# Patient Record
Sex: Female | Born: 1940
Health system: Southern US, Community
[De-identification: ages and names within clinical notes are randomized; demographics above are authoritative.]

## PROBLEM LIST (undated history)

## (undated) DIAGNOSIS — C801 Malignant (primary) neoplasm, unspecified: Secondary | ICD-10-CM

## (undated) HISTORY — DX: Malignant (primary) neoplasm, unspecified: C80.1

## (undated) HISTORY — PX: CHOLECYSTECTOMY: SHX55

---

## 1998-02-05 ENCOUNTER — Ambulatory Visit (HOSPITAL_COMMUNITY): Admission: RE | Admit: 1998-02-05 | Discharge: 1998-02-05 | Payer: Self-pay | Admitting: Internal Medicine

## 1998-03-11 ENCOUNTER — Ambulatory Visit (HOSPITAL_COMMUNITY): Admission: RE | Admit: 1998-03-11 | Discharge: 1998-03-11 | Payer: Self-pay | Admitting: Neurosurgery

## 1998-04-10 ENCOUNTER — Inpatient Hospital Stay (HOSPITAL_COMMUNITY): Admission: RE | Admit: 1998-04-10 | Discharge: 1998-04-11 | Payer: Self-pay | Admitting: Neurosurgery

## 1998-12-06 ENCOUNTER — Other Ambulatory Visit: Admission: RE | Admit: 1998-12-06 | Discharge: 1998-12-06 | Payer: Self-pay | Admitting: Gynecology

## 1999-11-19 ENCOUNTER — Ambulatory Visit (HOSPITAL_BASED_OUTPATIENT_CLINIC_OR_DEPARTMENT_OTHER): Admission: RE | Admit: 1999-11-19 | Discharge: 1999-11-19 | Payer: Self-pay

## 1999-12-24 ENCOUNTER — Other Ambulatory Visit: Admission: RE | Admit: 1999-12-24 | Discharge: 1999-12-24 | Payer: Self-pay | Admitting: Gynecology

## 2001-01-11 ENCOUNTER — Other Ambulatory Visit: Admission: RE | Admit: 2001-01-11 | Discharge: 2001-01-11 | Payer: Self-pay | Admitting: Gynecology

## 2001-08-12 ENCOUNTER — Encounter: Payer: Self-pay | Admitting: Emergency Medicine

## 2001-08-12 ENCOUNTER — Emergency Department (HOSPITAL_COMMUNITY): Admission: EM | Admit: 2001-08-12 | Discharge: 2001-08-12 | Payer: Self-pay | Admitting: *Deleted

## 2001-08-12 ENCOUNTER — Encounter: Payer: Self-pay | Admitting: Internal Medicine

## 2001-08-12 ENCOUNTER — Ambulatory Visit (HOSPITAL_COMMUNITY): Admission: RE | Admit: 2001-08-12 | Discharge: 2001-08-12 | Payer: Self-pay | Admitting: Internal Medicine

## 2002-03-03 ENCOUNTER — Other Ambulatory Visit: Admission: RE | Admit: 2002-03-03 | Discharge: 2002-03-03 | Payer: Self-pay | Admitting: Gynecology

## 2004-01-04 ENCOUNTER — Other Ambulatory Visit: Admission: RE | Admit: 2004-01-04 | Discharge: 2004-01-04 | Payer: Self-pay | Admitting: Gynecology

## 2004-02-19 ENCOUNTER — Encounter (INDEPENDENT_AMBULATORY_CARE_PROVIDER_SITE_OTHER): Payer: Self-pay | Admitting: *Deleted

## 2004-02-19 ENCOUNTER — Ambulatory Visit (HOSPITAL_BASED_OUTPATIENT_CLINIC_OR_DEPARTMENT_OTHER): Admission: RE | Admit: 2004-02-19 | Discharge: 2004-02-19 | Payer: Self-pay | Admitting: General Surgery

## 2004-02-19 ENCOUNTER — Ambulatory Visit (HOSPITAL_COMMUNITY): Admission: RE | Admit: 2004-02-19 | Discharge: 2004-02-19 | Payer: Self-pay | Admitting: General Surgery

## 2004-12-14 ENCOUNTER — Emergency Department (HOSPITAL_COMMUNITY): Admission: EM | Admit: 2004-12-14 | Discharge: 2004-12-14 | Payer: Self-pay | Admitting: *Deleted

## 2006-04-21 ENCOUNTER — Encounter: Admission: RE | Admit: 2006-04-21 | Discharge: 2006-04-21 | Payer: Self-pay | Admitting: Internal Medicine

## 2006-05-11 ENCOUNTER — Inpatient Hospital Stay (HOSPITAL_COMMUNITY): Admission: RE | Admit: 2006-05-11 | Discharge: 2006-05-13 | Payer: Self-pay | Admitting: Orthopedic Surgery

## 2007-11-23 ENCOUNTER — Ambulatory Visit (HOSPITAL_COMMUNITY): Admission: RE | Admit: 2007-11-23 | Discharge: 2007-11-23 | Payer: Self-pay | Admitting: Internal Medicine

## 2008-08-30 ENCOUNTER — Emergency Department (HOSPITAL_COMMUNITY): Admission: EM | Admit: 2008-08-30 | Discharge: 2008-08-30 | Payer: Self-pay | Admitting: *Deleted

## 2009-05-25 DIAGNOSIS — R7309 Other abnormal glucose: Secondary | ICD-10-CM | POA: Insufficient documentation

## 2009-05-25 DIAGNOSIS — E039 Hypothyroidism, unspecified: Secondary | ICD-10-CM | POA: Insufficient documentation

## 2009-05-25 DIAGNOSIS — J309 Allergic rhinitis, unspecified: Secondary | ICD-10-CM | POA: Insufficient documentation

## 2009-05-25 DIAGNOSIS — K5730 Diverticulosis of large intestine without perforation or abscess without bleeding: Secondary | ICD-10-CM | POA: Insufficient documentation

## 2009-05-25 DIAGNOSIS — Z720 Tobacco use: Secondary | ICD-10-CM | POA: Insufficient documentation

## 2009-05-25 DIAGNOSIS — E785 Hyperlipidemia, unspecified: Secondary | ICD-10-CM | POA: Insufficient documentation

## 2009-05-25 DIAGNOSIS — I872 Venous insufficiency (chronic) (peripheral): Secondary | ICD-10-CM | POA: Insufficient documentation

## 2010-01-29 ENCOUNTER — Encounter: Admission: RE | Admit: 2010-01-29 | Discharge: 2010-01-29 | Payer: Self-pay | Admitting: Surgery

## 2010-02-01 ENCOUNTER — Ambulatory Visit (HOSPITAL_BASED_OUTPATIENT_CLINIC_OR_DEPARTMENT_OTHER): Admission: RE | Admit: 2010-02-01 | Discharge: 2010-02-01 | Payer: Self-pay | Admitting: Surgery

## 2010-11-11 LAB — POCT HEMOGLOBIN-HEMACUE: Hemoglobin: 14.1 g/dL (ref 12.0–15.0)

## 2010-12-09 LAB — GLUCOSE, CAPILLARY: Glucose-Capillary: 128 mg/dL — ABNORMAL HIGH (ref 70–99)

## 2010-12-25 ENCOUNTER — Ambulatory Visit (HOSPITAL_BASED_OUTPATIENT_CLINIC_OR_DEPARTMENT_OTHER)
Admission: RE | Admit: 2010-12-25 | Discharge: 2010-12-25 | Disposition: A | Discharge status: Home / Self Care | Payer: Medicare Other | Source: Ambulatory Visit | Attending: Surgery | Admitting: Surgery

## 2010-12-25 ENCOUNTER — Other Ambulatory Visit: Payer: Self-pay | Admitting: Surgery

## 2010-12-25 DIAGNOSIS — C44721 Squamous cell carcinoma of skin of unspecified lower limb, including hip: Secondary | ICD-10-CM | POA: Insufficient documentation

## 2010-12-25 DIAGNOSIS — J4489 Other specified chronic obstructive pulmonary disease: Secondary | ICD-10-CM | POA: Insufficient documentation

## 2010-12-25 DIAGNOSIS — F17200 Nicotine dependence, unspecified, uncomplicated: Secondary | ICD-10-CM | POA: Insufficient documentation

## 2010-12-25 DIAGNOSIS — Z01812 Encounter for preprocedural laboratory examination: Secondary | ICD-10-CM | POA: Insufficient documentation

## 2010-12-25 DIAGNOSIS — J449 Chronic obstructive pulmonary disease, unspecified: Secondary | ICD-10-CM | POA: Insufficient documentation

## 2011-01-03 NOTE — Op Note (Signed)
  [name], [name]                ACCOUNT NO.:  [id number]  MEDICAL RECORD NO.:  [id number]           PATIENT TYPE:  LOCATION:                                FACILITY:  [facility]  PHYSICIAN:  Thornton Park. 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  PROCEDURE:  Excision of skin lesion with full-thickness skin graft.  DESCRIPTION OF PROCEDURE:  Jenna Ryan is a 70 year old lady with multiple cases of solar-induced skin cancer.  She developed a cutaneous horn on the left anterior shin.  That previously was about an inch away from this and excised and skin graft in the area.  This area was marked preoperatively and she was then taken to room 6 of [facility]. The lower abdomen on the left was prepped with PCMX as was entire left leg.  It was draped with a stockinette and first I went up and excised an ellipse of skin and closed that with 4-0 Vicryl subcutaneously and with benzoin and Steri-Strips.  I then defatted this ellipse and then fenestrated it with a #11 blade.  I then went down and excised this ellipse peeling off first the skin horn and then getting back a little away to skin graft this area.  My resection margin appeared to go to normal appearing skin, although all of her skin has got significant solar changes.  This was then resected and then the skin graft was applied with 5-0 Vicryl and sutured all the way around the perimeter and it was cut to fit.  It was then secured in place with a bolus dressing using parachute silk, Silvadene cream, tied it down with 4 such sutures and I then wrapped with Kerlix and Ace wrap. The patient seemed to tolerate the procedure well.  She will be given Vicodin for pain and will followed up in the office.     Thornton Park Daphine Deutscher, MD     MBM/MEDQ  D:   12/25/2010  T:  12/25/2010  Job:  [id number]  Electronically Signed by Luretha Murphy MD on 01/03/2011 08:36:03 AM
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
## 2011-01-10 NOTE — Op Note (Signed)
NAMEJAHMIYA, GUIDOTTI                ACCOUNT NO.:  [id number]   MEDICAL RECORD NO.:  [id number]          PATIENT TYPE:  INP   LOCATION:  5022                         FACILITY:  [facility]   PHYSICIAN:  Mila Homer. [name], M.D. DATE OF BIRTH:  [birth year]   DATE OF PROCEDURE:  05/11/2006  DATE OF DISCHARGE:                                 OPERATIVE REPORT   SURGEON:  Mila Homer. [name], M.D.   ASSISTANTArlys John D. Petrarca, P.A.-C.   ANESTHESIA:  General.   PREOPERATIVE DIAGNOSIS:  Right knee medial compartment arthritis.   POSTOPERATIVE DIAGNOSIS:  Right medial compartment arthritis.   PROCEDURE:  Right knee unicompartmental arthroplasty.   INDICATIONS FOR PROCEDURE:  Patient is a 70 year old with medial compartment  arthritis.  Informed consent was obtained.   DESCRIPTION OF PROCEDURE:  Patient was laid supine, administered general  anesthesia, and the right leg was prepped and draped in the usual sterile  fashion.  A small medial parapatellar arthrotomy was performed with a #10  blade from the medial pole of the medial inferior patella down to the tibial  tubercle.  Then made an intraarticular incision with the first #10 blade and  then removed the medial side of the retropatellar fat pad.  I then went into  extension removed the anterior lip of the tibia with a small sagittal saw  and then placed tensioning device and set up to check for the mechanical  axis.  I then checked under C-arm to make sure I was in the mechanical axis  and pinned the femoral cutting block into place.  I made the distal cutting  block with a small sagittal saw.  Then went into flexion and put the tibial  cutting block in place and used the sagittal saw to make the tibial cut.  I  then checked with the 8-mm and 10-mm tensioning devices and chose the 8.  At  this point, I finished the tibia with a size 2 tibial tray drilling keel and  used the femoral standard-plus and made the distal and posterior chamfer  cuts.  I then trialed with the standard-plus femur, 2 tibia, and 8 and 9  polys, and chose the 9 poly.  I then copiously irrigated and then cemented  in the components, removed excess cement, snapped in the real 9-mm  polyethylene, __________ the knee, put in an extension and allowed the  cement to harden.  I irrigated and closed the arthrotomy with interrupted #1  Vicryl sutures, deep soft tissue with interrupted 0 Vicryl sutures, the  subcuticular with 2-0 Vicryl stitch, and skin staples.   COMPLICATIONS:  None.   DRAINS:  None.           ______________________________  Mila Homer. [name], M.D.     SDL/MEDQ  D:  05/11/2006  T:  05/11/2006  Job:  [id number]

## 2011-01-10 NOTE — Op Note (Signed)
[place]  Patient:    Jenna Ryan, Jenna Ryan                       MRN: [id number] Proc. Date: 11/19/99 Adm. Date:  [id number] Attending:  Gennie Alma CC:         Elinor Parkinson. Worthy Rancher, M.D.                           Operative Report  [facility] [place] NO.:  [id number].  PREOPERATIVE DIAGNOSIS:  Squamous cell carcinoma of right anterior tibial skin.  POSTOPERATIVE DIAGNOSIS:  Squamous cell carcinoma of right anterior tibial skin.  OPERATION:  Excision of squamous cell carcinoma of anterior tibial region.  SURGEON:  Milus Mallick, M.D.  ANESTHESIA:  Local infiltration with 1% Xylocaine, with 1:200,000 parts of epinephrine and sodium bicarbonate (20 cc); monitored anesthesia care.  DESCRIPTION OF PROCEDURE:  Under adequate perioperative intravenous sedation, The patients right leg was turned and draped in the usual fashion.  There was a 6 mm in diameter ulcerated lesion of the anterior tibial region.  There was a halo of erythema around it (that was approximately 1.5 cm in diameter).  An elliptical incision was ______  designed.  There was 8 cm in vertical length and 3 cm in transverse width.  The incision area was then infiltrated with local anesthetic. An elliptical incision was then made and carried down to the deep fascia.  The lesion in question was excised with the ellipse.  Bleeders were electrocoagulated. There was a good deal of tension in the mid portion of the elliptical wound, and the skin would just barely approximate.  Medial and lateral skin flaps were developed, and there still was a great deal of tension.  The wound was closed after obtaining hemostasis with electrocoagulation.  The superior/inferior end of the  ellipse was closed with subcuticular sutures of 4-0 Vicryl, but the central portion would not come together with subcuticular sutures.  Skin was then closed with interrupted simple sutures of 4-0 nylon in  the edges of the ellipse and the areas where there was not any great tension.  However, in the central portion 3-0 nylon was used, both simple sutures and vertical mattress sutures; the incision was approximated.  It was dressed with Vaseline gauze and 4 x 4s, Kling wrap, and an ACE wrap from the foot to the knee.  The patient tolerated the procedure well and left the operative room in satisfactory condition. DD:  11/19/99 TD:  11/20/99 Job: [id number]

## 2011-01-10 NOTE — Op Note (Signed)
NAME:  Jenna Ryan, Jenna Ryan                          ACCOUNT NO.:  [id number]   MEDICAL RECORD NO.:  [id number]                   PATIENT TYPE:  AMB   LOCATION:  DSC                                  FACILITY:  [facility]   PHYSICIAN:  Adolph Pollack, M.D.            DATE OF BIRTH:  July 14, 1941   DATE OF PROCEDURE:  02/19/2004  DATE OF DISCHARGE:                                 OPERATIVE REPORT   PREOPERATIVE DIAGNOSIS:  Right lateral chest wall soft tissue mass.   POSTOPERATIVE DIAGNOSIS:  Right lateral chest wall soft tissue mass.   OPERATION PERFORMED:  Excision of a 2 cm right lateral chest wall soft  tissue mass.   SURGEON:  Adolph Pollack, M.D.   ANESTHESIA:  Local (mixture of 1% Xylocaine and 0.5% Marcaine).   INDICATIONS FOR PROCEDURE:  This is a 70 year old female who felt that she  noticed a little nodule on the lateral aspect of the right breast.  Mammogram was negative.  She came to me for evaluation of what appeared that  there was a soft tissue mass 2 cm that was lateral to the actual right  breast tissue.  Because she had noted growth from it, I felt it was  indicated to be removed and now she presents for that.   DESCRIPTION OF PROCEDURE:  She was placed supine on the operating table and  the mass was located and marked with a marking pen.  The area was sterilely  prepped and draped.  Local anesthetic was infiltrated directly over the area  and deep.  A transverse incision was made directly over the mass and carried  down to the subcutaneous tissue.  Using blunt dissection, a lipomatous mass  was removed measuring about 2 cm.  It was sent to pathology for evaluation.  Bleeding was controlled with direct pressure.  The skin was then closed with  a 4-0 Monocryl subcuticular stitch.  Steri-Strips and sterile dressing  applied.  The patient tolerated the procedure well without apparent  complications.  She was given discharge instructions.  She will plan to see  me back  in the office in 10 to 14 days.  She left the outpatient center in  satisfactory condition.                                               Adolph Pollack, M.D.    Kari Baars  D:  02/19/2004  T:  02/19/2004  Job:  [id number]   cc:   Luvenia Redden, M.D.  [address]  [place]  [place] [address]  Fax: [phone]

## 2011-02-25 ENCOUNTER — Encounter (INDEPENDENT_AMBULATORY_CARE_PROVIDER_SITE_OTHER): Payer: Medicare Other | Admitting: Surgery

## 2011-03-03 ENCOUNTER — Ambulatory Visit (INDEPENDENT_AMBULATORY_CARE_PROVIDER_SITE_OTHER): Payer: Medicare Other | Admitting: Surgery

## 2011-03-03 ENCOUNTER — Encounter (INDEPENDENT_AMBULATORY_CARE_PROVIDER_SITE_OTHER): Payer: Self-pay | Admitting: Surgery

## 2011-03-03 ENCOUNTER — Encounter (INDEPENDENT_AMBULATORY_CARE_PROVIDER_SITE_OTHER): Payer: Self-pay | Admitting: General Surgery

## 2011-03-03 VITALS — Temp 97.2°F

## 2011-03-03 DIAGNOSIS — C4492 Squamous cell carcinoma of skin, unspecified: Secondary | ICD-10-CM

## 2011-03-03 NOTE — Progress Notes (Signed)
Patient's skin graft is a small 1-1/2 cm eschar that is intact. Surrounding skin looks good. We will continue to keep a dry dressing on it. She can use Silvadene when needed. Re-examine her in 6 weeks.

## 2011-04-10 ENCOUNTER — Other Ambulatory Visit (HOSPITAL_COMMUNITY): Payer: Self-pay | Admitting: Orthopedic Surgery

## 2011-04-10 DIAGNOSIS — T84030A Mechanical loosening of internal right hip prosthetic joint, initial encounter: Secondary | ICD-10-CM

## 2011-04-15 ENCOUNTER — Encounter (HOSPITAL_COMMUNITY)
Admission: RE | Admit: 2011-04-15 | Discharge: 2011-04-15 | Disposition: A | Discharge status: Home / Self Care | Payer: Medicare Other | Source: Ambulatory Visit | Attending: Orthopedic Surgery | Admitting: Orthopedic Surgery

## 2011-04-15 ENCOUNTER — Ambulatory Visit (HOSPITAL_COMMUNITY)
Admission: RE | Admit: 2011-04-15 | Discharge: 2011-04-15 | Disposition: A | Discharge status: Home / Self Care | Payer: Medicare Other | Source: Ambulatory Visit | Attending: Orthopedic Surgery | Admitting: Orthopedic Surgery

## 2011-04-15 DIAGNOSIS — T84030A Mechanical loosening of internal right hip prosthetic joint, initial encounter: Secondary | ICD-10-CM

## 2011-04-15 DIAGNOSIS — Z96659 Presence of unspecified artificial knee joint: Secondary | ICD-10-CM | POA: Insufficient documentation

## 2011-04-15 MED ORDER — TECHNETIUM TC 99M MEDRONATE IV KIT
25.0000 | PACK | Freq: Once | INTRAVENOUS | Status: AC | PRN
  Administered 2011-04-15: 25 via INTRAVENOUS

## 2011-04-17 ENCOUNTER — Ambulatory Visit (INDEPENDENT_AMBULATORY_CARE_PROVIDER_SITE_OTHER): Payer: Medicare Other | Admitting: Surgery

## 2011-04-17 VITALS — BP 122/74 | HR 64 | Temp 96.6°F | Ht 66.0 in | Wt 217.1 lb

## 2011-04-17 DIAGNOSIS — Z945 Skin transplant status: Secondary | ICD-10-CM

## 2011-04-17 NOTE — Progress Notes (Signed)
Jenna Ryan's graft site has healed. There is still a bit of hypertrophy in the scar. Her other graft site is blending in with her surrounding skin. I want to reexamine this area and 3 months. In the meantime I have asked her to protect it from contact like scratches or work related things. 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 Plan return in 3 months

## 2011-04-17 NOTE — Patient Instructions (Signed)
Cover graft site to protect it when working in garden or when it could get bumped or scratched

## 2011-04-24 DIAGNOSIS — M179 Osteoarthritis of knee, unspecified: Secondary | ICD-10-CM | POA: Insufficient documentation

## 2011-05-15 ENCOUNTER — Encounter (HOSPITAL_COMMUNITY)
Admission: RE | Admit: 2011-05-15 | Discharge: 2011-05-15 | Disposition: A | Discharge status: Home / Self Care | Payer: Medicare Other | Source: Ambulatory Visit | Attending: Orthopedic Surgery | Admitting: Orthopedic Surgery

## 2011-05-15 LAB — URINALYSIS, ROUTINE W REFLEX MICROSCOPIC
Hgb urine dipstick: NEGATIVE
Ketones, ur: NEGATIVE mg/dL
Protein, ur: NEGATIVE mg/dL
Urobilinogen, UA: 0.2 mg/dL (ref 0.0–1.0)

## 2011-05-15 LAB — CBC
Hemoglobin: 13.5 g/dL (ref 12.0–15.0)
MCHC: 34 g/dL (ref 30.0–36.0)
Platelets: 162 10*3/uL (ref 150–400)
RBC: 4.23 MIL/uL (ref 3.87–5.11)

## 2011-05-15 LAB — COMPREHENSIVE METABOLIC PANEL
ALT: 16 U/L (ref 0–35)
Alkaline Phosphatase: 85 U/L (ref 39–117)
BUN: 13 mg/dL (ref 6–23)
CO2: 33 mEq/L — ABNORMAL HIGH (ref 19–32)
GFR calc Af Amer: >60 mL/min (ref >60)
GFR calc non Af Amer: 53 mL/min — ABNORMAL LOW (ref >60)
Glucose, Bld: 101 mg/dL — ABNORMAL HIGH (ref 70–99)
Potassium: 4.5 mEq/L (ref 3.5–5.1)
Sodium: 142 mEq/L (ref 135–145)
Total Bilirubin: 0.3 mg/dL (ref 0.3–1.2)
Total Protein: 6.6 g/dL (ref 6.0–8.3)

## 2011-05-15 LAB — PROTIME-INR
INR: 0.98 (ref 0.00–1.49)
Prothrombin Time: 13.2 seconds (ref 11.6–15.2)

## 2011-05-15 LAB — DIFFERENTIAL
Basophils Absolute: 0.1 10*3/uL (ref 0.0–0.1)
Basophils Relative: 1 % (ref 0–1)
Eosinophils Absolute: 0.1 10*3/uL (ref 0.0–0.7)
Neutro Abs: 3.8 10*3/uL (ref 1.7–7.7)
Neutrophils Relative %: 46 % (ref 43–77)

## 2011-05-15 LAB — SURGICAL PCR SCREEN
MRSA, PCR: POSITIVE — AB
Staphylococcus aureus: POSITIVE — AB

## 2011-05-15 LAB — URINE MICROSCOPIC-ADD ON

## 2011-05-16 LAB — URINE CULTURE: Culture  Setup Time: 201209201650

## 2011-05-26 ENCOUNTER — Inpatient Hospital Stay (HOSPITAL_COMMUNITY)
Admission: RE | Admit: 2011-05-26 | Discharge: 2011-05-29 | DRG: 467 | Disposition: A | Discharge status: Home Health | Payer: Medicare Other | Source: Ambulatory Visit | Attending: Orthopedic Surgery | Admitting: Orthopedic Surgery

## 2011-05-26 DIAGNOSIS — T84099A Other mechanical complication of unspecified internal joint prosthesis, initial encounter: Principal | ICD-10-CM | POA: Diagnosis present

## 2011-05-26 DIAGNOSIS — D62 Acute posthemorrhagic anemia: Secondary | ICD-10-CM | POA: Diagnosis not present

## 2011-05-26 DIAGNOSIS — F17200 Nicotine dependence, unspecified, uncomplicated: Secondary | ICD-10-CM | POA: Diagnosis present

## 2011-05-26 DIAGNOSIS — E039 Hypothyroidism, unspecified: Secondary | ICD-10-CM | POA: Diagnosis present

## 2011-05-26 DIAGNOSIS — Z01812 Encounter for preprocedural laboratory examination: Secondary | ICD-10-CM

## 2011-05-26 DIAGNOSIS — Z96659 Presence of unspecified artificial knee joint: Secondary | ICD-10-CM

## 2011-05-26 DIAGNOSIS — K219 Gastro-esophageal reflux disease without esophagitis: Secondary | ICD-10-CM | POA: Diagnosis present

## 2011-05-26 DIAGNOSIS — Y831 Surgical operation with implant of artificial internal device as the cause of abnormal reaction of the patient, or of later complication, without mention of misadventure at the time of the procedure: Secondary | ICD-10-CM | POA: Diagnosis present

## 2011-05-26 LAB — CROSSMATCH

## 2011-05-27 LAB — BASIC METABOLIC PANEL
BUN: 9 mg/dL (ref 6–23)
CO2: 31 mEq/L (ref 19–32)
Chloride: 101 mEq/L (ref 96–112)
GFR calc Af Amer: 72 mL/min — ABNORMAL LOW (ref >90)
Potassium: 4.1 mEq/L (ref 3.5–5.1)

## 2011-05-27 LAB — CBC
HCT: 33.6 % — ABNORMAL LOW (ref 36.0–46.0)
MCV: 94.6 fL (ref 78.0–100.0)
Platelets: 149 10*3/uL — ABNORMAL LOW (ref 150–400)
RBC: 3.55 MIL/uL — ABNORMAL LOW (ref 3.87–5.11)
RDW: 13.5 % (ref 11.5–15.5)
WBC: 6.5 10*3/uL (ref 4.0–10.5)

## 2011-05-28 LAB — CBC
HCT: 30.4 % — ABNORMAL LOW (ref 36.0–46.0)
Hemoglobin: 10 g/dL — ABNORMAL LOW (ref 12.0–15.0)
MCV: 94.1 fL (ref 78.0–100.0)
WBC: 7.6 10*3/uL (ref 4.0–10.5)

## 2011-05-28 LAB — BASIC METABOLIC PANEL
BUN: 9 mg/dL (ref 6–23)
Chloride: 101 mEq/L (ref 96–112)
Creatinine, Ser: 0.8 mg/dL (ref 0.50–1.10)
Glucose, Bld: 124 mg/dL — ABNORMAL HIGH (ref 70–99)
Potassium: 3.9 mEq/L (ref 3.5–5.1)

## 2011-05-29 LAB — CBC
HCT: 31.8 % — ABNORMAL LOW (ref 36.0–46.0)
Hemoglobin: 10.3 g/dL — ABNORMAL LOW (ref 12.0–15.0)
MCH: 30.5 pg (ref 26.0–34.0)
MCHC: 32.4 g/dL (ref 30.0–36.0)
MCV: 94.1 fL (ref 78.0–100.0)
RDW: 13.4 % (ref 11.5–15.5)

## 2011-05-29 LAB — BASIC METABOLIC PANEL
BUN: 8 mg/dL (ref 6–23)
Calcium: 9.3 mg/dL (ref 8.4–10.5)
Creatinine, Ser: 0.76 mg/dL (ref 0.50–1.10)
GFR calc non Af Amer: 82 mL/min — ABNORMAL LOW (ref >90)
Glucose, Bld: 105 mg/dL — ABNORMAL HIGH (ref 70–99)

## 2011-05-30 LAB — CROSSMATCH
ABO/RH(D): A POS
Antibody Screen: POSITIVE
DAT, IgG: POSITIVE
Unit division: 0
Unit division: 0

## 2011-06-01 NOTE — Op Note (Signed)
  [name], Jenna Ryan NO.:  [id number]  MEDICAL RECORD NO.:  [id number]  LOCATION:  5019                         FACILITY:  [facility]  PHYSICIAN:  Mila Homer. [name], M.D. DATE OF BIRTH:  [birth year]  DATE OF PROCEDURE:  05/26/2011 DATE OF DISCHARGE:                              OPERATIVE REPORT   SURGEON:  Mila Homer. [name], MD  ASSISTANT:  Altamese Cabal, PA-C  ANESTHESIA:  General.  PREOPERATIVE DIAGNOSIS:  Failed right unicompartmental arthroplasty.  POSTOPERATIVE DIAGNOSIS:  Failed right unicompartmental arthroplasty.  PROCEDURE:  Right revision total knee arthroplasty.  INDICATION FOR PROCEDURE:  The patient is a 70 year old white female with loosening of total knee arthroplasty after 6 years.  Informed consent was obtained.  DESCRIPTION OF PROCEDURE:  The patient was placed supine and administered general anesthesia.  Right leg was prepped and draped in usual sterile fashion.  The extremity was exsanguinated with the Esmarch and tourniquet inflated to 350 mmHg.  I made a midline incision incorporating the old incision with a #10 blade.  I used a new blade to make a median parapatellar arthrotomy and performed synovectomy.  I elevated the deep MCL off the medial crest of the tibia, exposed and went into flexion and used extramedullary alignment guide to make a perpendicular cut on the fibular surface going just beneath the unicompartmental tibial tray.  I removed the cut surface as well as the tray, curetted out the lug holes.  I then loosened up the femoral component and removed that.  It was well fixed.  I then used the intramedullary guide on 6 degrees valgus to cut the distal femur.  This completely eliminated any defect on the femur.  I then sized to a size D, pinned through the 3 degrees external rotation holes.  I then placed the lamina spreader on the knee, removed the ACL, PCL, and medial and lateral menisci, posterior condylar osteophytes.  I  then placed 14 spacer blocks in the knee.  I got good flexion/extension gap balance.  I finished with a size D on the femur, size 4 on the tibia.  I then trialed with a D femur, 4 tibia, 14 insert, and 32 patella, had good flexion/extension and gap balance.  I chose these components.  At this point, I cemented the components and allowed the cement to harden in extension.  I let the tourniquet down to obtain hemostasis after 62 minutes.  I closed the arthrotomy with figure-of-eight #1 Vicryl sutures, buried 0 Vicryl in the deep soft tissue, subcuticular 2-0 Vicryl stitches, and skin with staples.  Dressed with Xeroform dressing, sponges, sterile Webril.  COMPLICATIONS:  None.  DRAINS:  One Hemovac, one pain catheter.  ESTIMATED BLOOD LOSS:  300 mL.          ______________________________ Mila Homer. [name], M.D.     SDL/MEDQ  D:  05/26/2011  T:  05/26/2011  Job:  [id number]  Electronically Signed by Georgena Spurling M.D. on 06/01/2011 11:23:09 AM

## 2011-08-01 ENCOUNTER — Encounter (INDEPENDENT_AMBULATORY_CARE_PROVIDER_SITE_OTHER): Payer: Medicare Other | Admitting: Surgery

## 2011-08-08 ENCOUNTER — Ambulatory Visit (INDEPENDENT_AMBULATORY_CARE_PROVIDER_SITE_OTHER): Payer: Medicare Other | Admitting: Surgery

## 2011-08-08 ENCOUNTER — Encounter (INDEPENDENT_AMBULATORY_CARE_PROVIDER_SITE_OTHER): Payer: Self-pay | Admitting: Surgery

## 2011-08-08 VITALS — BP 150/80 | HR 82 | Resp 18 | Ht 65.5 in | Wt 207.0 lb

## 2011-08-08 DIAGNOSIS — Z9889 Other specified postprocedural states: Secondary | ICD-10-CM

## 2011-08-08 DIAGNOSIS — Z945 Skin transplant status: Secondary | ICD-10-CM | POA: Insufficient documentation

## 2011-08-08 NOTE — Progress Notes (Signed)
Jenna Ryan's grafted side on her left leg looks great. It's healed. She is in the meantime had a right knee replacement. She looks good having lost some weight 2. We will see her again as needed. Return p.r.n.

## 2011-08-08 NOTE — Patient Instructions (Signed)
Return if needed

## 2012-01-08 ENCOUNTER — Ambulatory Visit (INDEPENDENT_AMBULATORY_CARE_PROVIDER_SITE_OTHER): Payer: Medicare Other | Admitting: Surgery

## 2012-01-08 ENCOUNTER — Other Ambulatory Visit (INDEPENDENT_AMBULATORY_CARE_PROVIDER_SITE_OTHER): Payer: Self-pay | Admitting: General Surgery

## 2012-01-08 ENCOUNTER — Encounter (INDEPENDENT_AMBULATORY_CARE_PROVIDER_SITE_OTHER): Payer: Self-pay | Admitting: Surgery

## 2012-01-08 ENCOUNTER — Other Ambulatory Visit (INDEPENDENT_AMBULATORY_CARE_PROVIDER_SITE_OTHER): Payer: Self-pay | Admitting: Surgery

## 2012-01-08 VITALS — BP 140/92 | HR 88 | Temp 97.8°F | Resp 14 | Ht 65.5 in | Wt 216.8 lb

## 2012-01-08 DIAGNOSIS — C4492 Squamous cell carcinoma of skin, unspecified: Secondary | ICD-10-CM

## 2012-01-08 NOTE — Progress Notes (Signed)
Jenna Ryan comes in today with 2 new areas of solar neoplasia. One on her right dorsal foot and one on her left shoulder which is actually bigger rubs her bra strap. Long time ago she self reluctant and they didn't have a pleasant encounter. I think she needs to see a dermatologist before I began taking off more moles as she's asked me to "D. mole her"   she does have multiple places on her back the before we embark on such a surgical adventure I want to have a dermatologist look at her. I will therefore recommend that she see Dr. Venancio Poisson or one of her partners.    Will schedule her for excision of the raised mass on her left shoulder and right foot.

## 2012-01-13 ENCOUNTER — Telehealth (INDEPENDENT_AMBULATORY_CARE_PROVIDER_SITE_OTHER): Payer: Self-pay | Admitting: Surgery

## 2012-02-05 ENCOUNTER — Other Ambulatory Visit: Payer: Self-pay | Admitting: Dermatology

## 2012-02-19 ENCOUNTER — Other Ambulatory Visit: Payer: Self-pay | Admitting: Dermatology

## 2012-03-11 ENCOUNTER — Other Ambulatory Visit: Payer: Self-pay

## 2012-05-13 ENCOUNTER — Other Ambulatory Visit: Payer: Self-pay | Admitting: Dermatology

## 2012-09-09 ENCOUNTER — Other Ambulatory Visit (HOSPITAL_COMMUNITY): Payer: Self-pay | Admitting: *Deleted

## 2012-09-10 ENCOUNTER — Ambulatory Visit (HOSPITAL_COMMUNITY)
Admission: RE | Admit: 2012-09-10 | Discharge: 2012-09-10 | Disposition: A | Discharge status: Home / Self Care | Payer: Medicare Other | Source: Ambulatory Visit | Attending: Internal Medicine | Admitting: Internal Medicine

## 2012-09-10 DIAGNOSIS — M810 Age-related osteoporosis without current pathological fracture: Secondary | ICD-10-CM | POA: Insufficient documentation

## 2012-09-10 MED ORDER — ZOLEDRONIC ACID 5 MG/100ML IV SOLN
INTRAVENOUS | Status: AC
  Administered 2012-09-10: 5 mg via INTRAVENOUS
  Filled 2012-09-10: qty 100

## 2012-09-10 MED ORDER — ZOLEDRONIC ACID 5 MG/100ML IV SOLN
5.0000 mg | Freq: Once | INTRAVENOUS | Status: AC
  Administered 2012-09-10: 5 mg via INTRAVENOUS

## 2012-09-16 DIAGNOSIS — M25569 Pain in unspecified knee: Secondary | ICD-10-CM | POA: Insufficient documentation

## 2012-09-17 ENCOUNTER — Other Ambulatory Visit (HOSPITAL_COMMUNITY): Payer: Self-pay | Admitting: Orthopedic Surgery

## 2012-09-17 ENCOUNTER — Encounter (HOSPITAL_COMMUNITY): Payer: Medicare Other

## 2012-09-17 DIAGNOSIS — M25561 Pain in right knee: Secondary | ICD-10-CM

## 2012-09-22 ENCOUNTER — Encounter (HOSPITAL_COMMUNITY): Payer: Medicare Other

## 2012-09-27 ENCOUNTER — Encounter (HOSPITAL_COMMUNITY)
Admission: RE | Admit: 2012-09-27 | Discharge: 2012-09-27 | Disposition: A | Discharge status: Home / Self Care | Payer: Medicare Other | Source: Ambulatory Visit | Attending: Orthopedic Surgery | Admitting: Orthopedic Surgery

## 2012-09-27 DIAGNOSIS — M25569 Pain in unspecified knee: Secondary | ICD-10-CM | POA: Insufficient documentation

## 2012-09-27 DIAGNOSIS — M25561 Pain in right knee: Secondary | ICD-10-CM

## 2012-09-27 DIAGNOSIS — Z96659 Presence of unspecified artificial knee joint: Secondary | ICD-10-CM | POA: Insufficient documentation

## 2012-09-27 MED ORDER — TECHNETIUM TC 99M MEDRONATE IV KIT
25.0000 | PACK | Freq: Once | INTRAVENOUS | Status: AC | PRN
  Administered 2012-09-27: 25 via INTRAVENOUS

## 2012-10-01 ENCOUNTER — Other Ambulatory Visit: Payer: Self-pay | Admitting: Dermatology

## 2012-11-11 ENCOUNTER — Other Ambulatory Visit: Payer: Self-pay | Admitting: Dermatology

## 2012-12-31 ENCOUNTER — Other Ambulatory Visit: Payer: Self-pay | Admitting: Dermatology

## 2013-02-15 ENCOUNTER — Other Ambulatory Visit: Payer: Self-pay | Admitting: Dermatology

## 2013-04-14 ENCOUNTER — Other Ambulatory Visit: Payer: Self-pay | Admitting: Dermatology

## 2013-04-25 ENCOUNTER — Emergency Department (HOSPITAL_COMMUNITY): Payer: Medicare Other

## 2013-04-25 ENCOUNTER — Emergency Department (HOSPITAL_COMMUNITY)
Admission: EM | Admit: 2013-04-25 | Discharge: 2013-04-26 | Disposition: A | Discharge status: Home / Self Care | Payer: Medicare Other | Attending: Emergency Medicine | Admitting: Emergency Medicine

## 2013-04-25 ENCOUNTER — Encounter (HOSPITAL_COMMUNITY): Payer: Self-pay | Admitting: *Deleted

## 2013-04-25 DIAGNOSIS — Z880 Allergy status to penicillin: Secondary | ICD-10-CM | POA: Insufficient documentation

## 2013-04-25 DIAGNOSIS — Z23 Encounter for immunization: Secondary | ICD-10-CM | POA: Insufficient documentation

## 2013-04-25 DIAGNOSIS — Z79899 Other long term (current) drug therapy: Secondary | ICD-10-CM | POA: Insufficient documentation

## 2013-04-25 DIAGNOSIS — F17200 Nicotine dependence, unspecified, uncomplicated: Secondary | ICD-10-CM | POA: Insufficient documentation

## 2013-04-25 DIAGNOSIS — S42202A Unspecified fracture of upper end of left humerus, initial encounter for closed fracture: Secondary | ICD-10-CM

## 2013-04-25 DIAGNOSIS — W010XXA Fall on same level from slipping, tripping and stumbling without subsequent striking against object, initial encounter: Secondary | ICD-10-CM | POA: Insufficient documentation

## 2013-04-25 DIAGNOSIS — Z85828 Personal history of other malignant neoplasm of skin: Secondary | ICD-10-CM | POA: Insufficient documentation

## 2013-04-25 DIAGNOSIS — Y929 Unspecified place or not applicable: Secondary | ICD-10-CM | POA: Insufficient documentation

## 2013-04-25 DIAGNOSIS — Y9301 Activity, walking, marching and hiking: Secondary | ICD-10-CM | POA: Insufficient documentation

## 2013-04-25 DIAGNOSIS — W19XXXA Unspecified fall, initial encounter: Secondary | ICD-10-CM

## 2013-04-25 DIAGNOSIS — S42209A Unspecified fracture of upper end of unspecified humerus, initial encounter for closed fracture: Secondary | ICD-10-CM | POA: Insufficient documentation

## 2013-04-25 LAB — CBC WITH DIFFERENTIAL/PLATELET
Basophils Absolute: 0 10*3/uL (ref 0.0–0.1)
Basophils Relative: 0 % (ref 0–1)
Eosinophils Absolute: 0.1 10*3/uL (ref 0.0–0.7)
Eosinophils Relative: 1 % (ref 0–5)
MCH: 32.1 pg (ref 26.0–34.0)
MCV: 92.7 fL (ref 78.0–100.0)
Neutrophils Relative %: 51 % (ref 43–77)
Platelets: 177 10*3/uL (ref 150–400)
RBC: 4.39 MIL/uL (ref 3.87–5.11)
RDW: 13.6 % (ref 11.5–15.5)

## 2013-04-25 LAB — COMPREHENSIVE METABOLIC PANEL
ALT: 38 U/L — ABNORMAL HIGH (ref 0–35)
AST: 31 U/L (ref 0–37)
Albumin: 3.9 g/dL (ref 3.5–5.2)
Alkaline Phosphatase: 76 U/L (ref 39–117)
Calcium: 9.6 mg/dL (ref 8.4–10.5)
GFR calc Af Amer: 73 mL/min — ABNORMAL LOW (ref >90)
Potassium: 3.6 mEq/L (ref 3.5–5.1)
Sodium: 140 mEq/L (ref 135–145)
Total Protein: 6.8 g/dL (ref 6.0–8.3)

## 2013-04-25 MED ORDER — FENTANYL CITRATE 0.05 MG/ML IJ SOLN
100.0000 ug | Freq: Once | INTRAMUSCULAR | Status: AC
  Administered 2013-04-25: 100 ug via INTRAVENOUS
  Filled 2013-04-25: qty 2

## 2013-04-25 MED ORDER — HYDROCODONE-ACETAMINOPHEN 5-325 MG PO TABS: 2.0000 | ORAL_TABLET | ORAL | Status: AC | PRN

## 2013-04-25 MED ORDER — TETANUS-DIPHTH-ACELL PERTUSSIS 5-2.5-18.5 LF-MCG/0.5 IM SUSP
0.5000 mL | Freq: Once | INTRAMUSCULAR | Status: AC
  Administered 2013-04-25: 0.5 mL via INTRAMUSCULAR
  Filled 2013-04-25: qty 0.5

## 2013-04-25 NOTE — ED Provider Notes (Signed)
CSN: [id number]     Arrival date & time 04/25/13  2123 History   First MD Initiated Contact with Patient 04/25/13 2138     Chief Complaint  Patient presents with  . Fall  . Shoulder Injury   (Consider location/radiation/quality/duration/timing/severity/associated sxs/prior Treatment) HPI Comments: Patient presents with left shoulder and arm pain after mechanical fall. She states she was walking and tripped over a 2 foot high brick wall and landed on her left shoulder. Husband states she hit her head but did not lose consciousness. She denies any head, neck, back, chest or abdominal pain. Denies any dizziness, lightheadedness, or focal weakness, numbness or tingling.   The history is provided by the patient and the spouse.    Past Medical History  Diagnosis Date  . Cancer     skin   Past Surgical History  Procedure Laterality Date  . Cholecystectomy     Family History  Problem Relation Age of Onset  . Diabetes Mother   . Alzheimer's disease Father    History  Substance Use Topics  . Smoking status: Current Every Day Smoker  . Smokeless tobacco: Not on file  . Alcohol Use: No   OB History   Grav Para Term Preterm Abortions TAB SAB Ect Mult Living                 Review of Systems  Constitutional: Negative for fever, activity change and appetite change.  HENT: Negative for congestion, rhinorrhea and neck pain.   Respiratory: Negative for chest tightness.   Cardiovascular: Negative for chest pain.  Gastrointestinal: Negative for nausea, vomiting and abdominal pain.  Genitourinary: Negative for dysuria, hematuria, vaginal bleeding and vaginal discharge.  Musculoskeletal: Positive for myalgias and arthralgias. Negative for back pain.  Skin: Negative for rash.  Neurological: Negative for dizziness, weakness and headaches.  A complete 10 system review of systems was obtained and all systems are negative except as noted in the HPI and PMH.    Allergies  Macrolides and  ketolides; Penicillins; and Sulfa antibiotics  Home Medications   Current Outpatient Rx  Name  Route  Sig  Dispense  Refill  . ibuprofen (ADVIL,MOTRIN) 200 MG tablet   Oral   Take 400 mg by mouth every 6 (six) hours as needed for pain.         Marland Kitchen levothyroxine (SYNTHROID, LEVOTHROID) 88 MCG tablet   Oral   Take 88 mcg by mouth daily.         . rosuvastatin (CRESTOR) 5 MG tablet   Oral   Take 5 mg by mouth daily.         . Vitamin D, Ergocalciferol, (DRISDOL) 50000 UNITS CAPS   Oral   Take 50,000 Units by mouth.         Marland Kitchen HYDROcodone-acetaminophen (NORCO/VICODIN) 5-325 MG per tablet   Oral   Take 2 tablets by mouth every 4 (four) hours as needed for pain.   20 tablet   0    BP 129/63  Pulse 70  Temp(Src) 97.8 F (36.6 C) (Oral)  Resp 18  SpO2 98% Physical Exam  Constitutional: She is oriented to person, place, and time. She appears well-developed and well-nourished. No distress.  HENT:  Head: Normocephalic and atraumatic.  Left Ear: External ear normal.  Mouth/Throat: Oropharynx is clear and moist. No oropharyngeal exudate.  Eyes: Conjunctivae and EOM are normal. Pupils are equal, round, and reactive to light.  Neck: Normal range of motion. Neck supple.  No C-spine pain,  step-off or deformity  Cardiovascular: Normal rate, regular rhythm and normal heart sounds.   No murmur heard. Pulmonary/Chest: Effort normal and breath sounds normal. No respiratory distress.  Abdominal: Soft. There is no tenderness. There is no rebound and no guarding.  Musculoskeletal: She exhibits tenderness. She exhibits no edema.  No tenderness to T. or L-spine  Neurological: She is alert and oriented to person, place, and time. No cranial nerve deficit. She exhibits normal muscle tone. Coordination normal.  Skin: Skin is warm.    ED Course  Procedures (including critical care time) Labs Review Labs Reviewed  COMPREHENSIVE METABOLIC PANEL - Abnormal; Notable for the following:     Glucose, Bld 129 (*)    ALT 38 (*)    GFR calc non Af Amer 63 (*)    GFR calc Af Amer 73 (*)    All other components within normal limits  CBC WITH DIFFERENTIAL  PROTIME-INR   Imaging Review Dg Chest 1 View  04/25/2013   *RADIOLOGY REPORT*  Clinical Data: Fall  CHEST - 1 VIEW  Comparison: Prior radiograph from 01/29/2010  Findings: Cardiac and mediastinal silhouettes are stable in size and contour, and remain within normal limits.  Tortuosity of the intrathoracic aorta is noted.  Atherosclerotic calcifications are present within the aortic arch.  Lungs are normally inflated.  There is no airspace consolidation, pleural effusion, or pulmonary edema.  No pneumothorax.  ACDF overlying the cervical spine is again noted.  No acute osseous abnormality.  IMPRESSION: No acute cardiopulmonary process.   Original Report Authenticated By: Rise Mu, M.D.   Dg Elbow 2 Views Left  04/25/2013   *RADIOLOGY REPORT*  Clinical Data: Fall with pain.  LEFT ELBOW - 2 VIEW  Comparison: Left humerus and left shoulder radiographs  Findings: Positioning for the elbow radiographs is markedly suboptimal, due to patient pain related to pain proximal humerus fracture.  No fracture is identified in the distal humerus, the proximal radius, or the proximal ulna.  No joint effusion is appreciated.  IMPRESSION: No acute bony abnormality identified. Exam quality is degraded by difficulty with patient positioning, due to pain.   Original Report Authenticated By: Britta Mccreedy, M.D.   Ct Head Wo Contrast  04/25/2013   *RADIOLOGY REPORT*  Clinical Data:  ,, shoulder injury  CT HEAD WITHOUT CONTRAST CT CERVICAL SPINE WITHOUT CONTRAST  Technique:  Multidetector CT imaging of the head and cervical spine was performed following the standard protocol without intravenous contrast.  Multiplanar CT image reconstructions of the cervical spine were also generated.  Comparison:   None  CT HEAD  Findings: Diffuse atrophy is present. Scattered  and confluent hypodensity within the periventricular white matter is consistent with chronic small vessel ischemic changes.  No acute intracranial hemorrhage or infarct.  No extra-axial fluid collection.  No mass or midline shift.  Calvarium is intact.  Orbital soft tissues are normal.  Paranasal sinuses and mastoid air cells are clear.  IMPRESSION: Atrophy with chronic microvascular ischemic disease.  No acute intracranial process.  CT CERVICAL SPINE  Findings: The patient is status post ACDF at the C4, C5, and C6 levels.  Hardware appears normally aligned without evidence of complication. There is essentially complete osseous fusion at these levels.  There is no acute fracture listhesis.  Normal C1-2 articulations are intact.  No prevertebral soft tissue swelling. Multilevel degenerative disc disease is present throughout cervical spine.  Visualized lung apices are clear.  IMPRESSION: 1. No CT evidence of acute fracture or listhesis within  the cervical spine. 2. ACDF at C4-C6 without complication.   Original Report Authenticated By: Rise Mu, M.D.   Ct Cervical Spine Wo Contrast  04/25/2013   *RADIOLOGY REPORT*  Clinical Data:  ,, shoulder injury  CT HEAD WITHOUT CONTRAST CT CERVICAL SPINE WITHOUT CONTRAST  Technique:  Multidetector CT imaging of the head and cervical spine was performed following the standard protocol without intravenous contrast.  Multiplanar CT image reconstructions of the cervical spine were also generated.  Comparison:   None  CT HEAD  Findings: Diffuse atrophy is present. Scattered and confluent hypodensity within the periventricular white matter is consistent with chronic small vessel ischemic changes.  No acute intracranial hemorrhage or infarct.  No extra-axial fluid collection.  No mass or midline shift.  Calvarium is intact.  Orbital soft tissues are normal.  Paranasal sinuses and mastoid air cells are clear.  IMPRESSION: Atrophy with chronic microvascular ischemic disease.   No acute intracranial process.  CT CERVICAL SPINE  Findings: The patient is status post ACDF at the C4, C5, and C6 levels.  Hardware appears normally aligned without evidence of complication. There is essentially complete osseous fusion at these levels.  There is no acute fracture listhesis.  Normal C1-2 articulations are intact.  No prevertebral soft tissue swelling. Multilevel degenerative disc disease is present throughout cervical spine.  Visualized lung apices are clear.  IMPRESSION: 1. No CT evidence of acute fracture or listhesis within the cervical spine. 2. ACDF at C4-C6 without complication.   Original Report Authenticated By: Rise Mu, M.D.   Dg Shoulder Left  04/25/2013   *RADIOLOGY REPORT*  Clinical Data: Fall to mild left shoulder  LEFT SHOULDER - 2+ VIEW  Comparison: Left humerus radiographs.  Findings: An AP view of the left shoulder shows an impacted fracture of the humeral neck.  Fracture appears to extend into the greater tuberosity.  IMPRESSION: Acute, impacted proximal left humerus fracture.   Original Report Authenticated By: Britta Mccreedy, M.D.   Dg Knee Complete 4 Views Left  04/25/2013   *RADIOLOGY REPORT*  Clinical Data: Fall.  Abrasion left anterior knee  LEFT KNEE - COMPLETE 4+ VIEW  Comparison: None.  Findings: Normal alignment.  No evidence of joint effusion or acute fracture.  There is mild osteoarthritis of the medial compartment. No discrete focal soft tissue swelling appreciated radiographically.  IMPRESSION:  1.  No acute bony abnormality identified. 2.  Mild medial compartment osteoarthritis.   Original Report Authenticated By: Britta Mccreedy, M.D.   Dg Humerus Left  04/25/2013   *RADIOLOGY REPORT*  Clinical Data: Fall tonight.  LEFT HUMERUS - 2+ VIEW  Comparison: Left shoulder radiograph appear  Findings: AP and trans thoracic lateral views are submitted.  The humeral head appears located.  No evidence of subluxation or dislocation.  There is an acute impacted  fracture of the proximal left humerus.  The fracture involves the humeral neck, and extends into the greater tuberosity.  Greater tuberosity fracture fragment is laterally displaced approximately 6 mm.  IMPRESSION:  1.  Acute impacted fracture of the proximal to prior left humerus, with slight lateral displacement of the greater tuberosity. 2.  No evidence of subluxation or dislocation.   Original Report Authenticated By: Britta Mccreedy, M.D.    MDM   1. Proximal humerus fracture, left, closed, initial encounter   2. Fall, initial encounter    Mechanical fall with left shoulder and head injury. No loss of consciousness. Neurologically intact. vascularly intact.  CT head and C spine negative.  Proximal humerus fracture on Xray.  +2 radial pulse, cardinal hand movements intact. Patient had knee replacement by Dr. [name] and does not wish to see him again. D/w on call ortho Dr. Charlann Boxer who agrees with sling, pain control, and followup in office with Dr. Rennis Chris on Weds.   Pain controlled in the ED.  No vomiting.  Patient feels like she can manage at home.     Glynn Octave, MD 04/25/13 [phone]

## 2013-04-25 NOTE — ED Notes (Signed)
Pt states that she tripped and fell. Pt landed with her left arm extended and jolted her shoulder. Pt unable to move left arm due to pain. Pt denies LOC. Pt shoulder does appear lower on the left side compared to right.

## 2013-12-23 ENCOUNTER — Other Ambulatory Visit (HOSPITAL_COMMUNITY): Payer: Self-pay | Admitting: *Deleted

## 2013-12-26 ENCOUNTER — Inpatient Hospital Stay (HOSPITAL_COMMUNITY): Admission: RE | Admit: 2013-12-26 | Payer: Medicare Other | Source: Ambulatory Visit

## 2014-03-17 ENCOUNTER — Other Ambulatory Visit: Payer: Self-pay | Admitting: Dermatology

## 2014-06-22 DIAGNOSIS — N1832 Chronic kidney disease, stage 3b: Secondary | ICD-10-CM | POA: Insufficient documentation

## 2014-06-22 DIAGNOSIS — E669 Obesity, unspecified: Secondary | ICD-10-CM | POA: Insufficient documentation

## 2014-06-22 DIAGNOSIS — C4492 Squamous cell carcinoma of skin, unspecified: Secondary | ICD-10-CM | POA: Insufficient documentation

## 2014-06-22 DIAGNOSIS — F430 Acute stress reaction: Secondary | ICD-10-CM | POA: Insufficient documentation

## 2014-09-01 ENCOUNTER — Other Ambulatory Visit: Payer: Self-pay | Admitting: Dermatology

## 2014-10-12 ENCOUNTER — Other Ambulatory Visit: Payer: Self-pay | Admitting: Dermatology

## 2014-12-04 ENCOUNTER — Other Ambulatory Visit: Payer: Self-pay | Admitting: Dermatology

## 2015-04-16 DIAGNOSIS — D692 Other nonthrombocytopenic purpura: Secondary | ICD-10-CM | POA: Insufficient documentation

## 2015-06-15 DIAGNOSIS — N390 Urinary tract infection, site not specified: Secondary | ICD-10-CM | POA: Diagnosis not present

## 2015-06-15 DIAGNOSIS — R300 Dysuria: Secondary | ICD-10-CM | POA: Diagnosis not present

## 2015-06-15 DIAGNOSIS — R8299 Other abnormal findings in urine: Secondary | ICD-10-CM | POA: Diagnosis not present

## 2015-06-15 DIAGNOSIS — Z23 Encounter for immunization: Secondary | ICD-10-CM | POA: Diagnosis not present

## 2015-06-19 DIAGNOSIS — C44622 Squamous cell carcinoma of skin of right upper limb, including shoulder: Secondary | ICD-10-CM | POA: Diagnosis not present

## 2015-06-19 DIAGNOSIS — C44729 Squamous cell carcinoma of skin of left lower limb, including hip: Secondary | ICD-10-CM | POA: Diagnosis not present

## 2015-06-19 DIAGNOSIS — Z85828 Personal history of other malignant neoplasm of skin: Secondary | ICD-10-CM | POA: Diagnosis not present

## 2015-06-19 DIAGNOSIS — D485 Neoplasm of uncertain behavior of skin: Secondary | ICD-10-CM | POA: Diagnosis not present

## 2015-06-19 DIAGNOSIS — C44722 Squamous cell carcinoma of skin of right lower limb, including hip: Secondary | ICD-10-CM | POA: Diagnosis not present

## 2015-06-27 DIAGNOSIS — N183 Chronic kidney disease, stage 3 (moderate): Secondary | ICD-10-CM | POA: Diagnosis not present

## 2015-06-27 DIAGNOSIS — E559 Vitamin D deficiency, unspecified: Secondary | ICD-10-CM | POA: Diagnosis not present

## 2015-06-27 DIAGNOSIS — Z0000 Encounter for general adult medical examination without abnormal findings: Secondary | ICD-10-CM | POA: Diagnosis not present

## 2015-06-27 DIAGNOSIS — E785 Hyperlipidemia, unspecified: Secondary | ICD-10-CM | POA: Diagnosis not present

## 2015-06-27 DIAGNOSIS — E039 Hypothyroidism, unspecified: Secondary | ICD-10-CM | POA: Diagnosis not present

## 2015-06-27 DIAGNOSIS — R7302 Impaired glucose tolerance (oral): Secondary | ICD-10-CM | POA: Diagnosis not present

## 2015-06-27 DIAGNOSIS — M810 Age-related osteoporosis without current pathological fracture: Secondary | ICD-10-CM | POA: Diagnosis not present

## 2015-07-04 DIAGNOSIS — M810 Age-related osteoporosis without current pathological fracture: Secondary | ICD-10-CM | POA: Diagnosis not present

## 2015-07-04 DIAGNOSIS — E669 Obesity, unspecified: Secondary | ICD-10-CM | POA: Diagnosis not present

## 2015-07-04 DIAGNOSIS — N183 Chronic kidney disease, stage 3 (moderate): Secondary | ICD-10-CM | POA: Diagnosis not present

## 2015-07-04 DIAGNOSIS — M179 Osteoarthritis of knee, unspecified: Secondary | ICD-10-CM | POA: Diagnosis not present

## 2015-07-04 DIAGNOSIS — E039 Hypothyroidism, unspecified: Secondary | ICD-10-CM | POA: Diagnosis not present

## 2015-07-04 DIAGNOSIS — E784 Other hyperlipidemia: Secondary | ICD-10-CM | POA: Diagnosis not present

## 2015-07-04 DIAGNOSIS — J449 Chronic obstructive pulmonary disease, unspecified: Secondary | ICD-10-CM | POA: Diagnosis not present

## 2015-07-04 DIAGNOSIS — R030 Elevated blood-pressure reading, without diagnosis of hypertension: Secondary | ICD-10-CM | POA: Diagnosis not present

## 2015-07-04 DIAGNOSIS — Z0000 Encounter for general adult medical examination without abnormal findings: Secondary | ICD-10-CM | POA: Diagnosis not present

## 2015-07-04 DIAGNOSIS — R7302 Impaired glucose tolerance (oral): Secondary | ICD-10-CM | POA: Diagnosis not present

## 2015-07-09 DIAGNOSIS — Z85828 Personal history of other malignant neoplasm of skin: Secondary | ICD-10-CM | POA: Diagnosis not present

## 2015-07-09 DIAGNOSIS — C44722 Squamous cell carcinoma of skin of right lower limb, including hip: Secondary | ICD-10-CM | POA: Diagnosis not present

## 2015-09-24 DIAGNOSIS — R300 Dysuria: Secondary | ICD-10-CM | POA: Diagnosis not present

## 2015-09-24 DIAGNOSIS — N390 Urinary tract infection, site not specified: Secondary | ICD-10-CM | POA: Diagnosis not present

## 2015-10-23 DIAGNOSIS — Z85828 Personal history of other malignant neoplasm of skin: Secondary | ICD-10-CM | POA: Diagnosis not present

## 2015-10-23 DIAGNOSIS — L821 Other seborrheic keratosis: Secondary | ICD-10-CM | POA: Diagnosis not present

## 2015-10-23 DIAGNOSIS — L820 Inflamed seborrheic keratosis: Secondary | ICD-10-CM | POA: Diagnosis not present

## 2015-11-01 DIAGNOSIS — R69 Illness, unspecified: Secondary | ICD-10-CM | POA: Diagnosis not present

## 2015-11-01 DIAGNOSIS — N390 Urinary tract infection, site not specified: Secondary | ICD-10-CM | POA: Diagnosis not present

## 2015-11-01 DIAGNOSIS — J310 Chronic rhinitis: Secondary | ICD-10-CM | POA: Diagnosis not present

## 2015-11-01 DIAGNOSIS — Z6836 Body mass index (BMI) 36.0-36.9, adult: Secondary | ICD-10-CM | POA: Diagnosis not present

## 2015-11-01 DIAGNOSIS — R030 Elevated blood-pressure reading, without diagnosis of hypertension: Secondary | ICD-10-CM | POA: Diagnosis not present

## 2015-11-01 DIAGNOSIS — R8299 Other abnormal findings in urine: Secondary | ICD-10-CM | POA: Diagnosis not present

## 2015-11-01 DIAGNOSIS — D692 Other nonthrombocytopenic purpura: Secondary | ICD-10-CM | POA: Diagnosis not present

## 2015-11-01 DIAGNOSIS — J449 Chronic obstructive pulmonary disease, unspecified: Secondary | ICD-10-CM | POA: Diagnosis not present

## 2015-11-01 DIAGNOSIS — R7302 Impaired glucose tolerance (oral): Secondary | ICD-10-CM | POA: Diagnosis not present

## 2015-11-01 DIAGNOSIS — R300 Dysuria: Secondary | ICD-10-CM | POA: Diagnosis not present

## 2015-11-01 DIAGNOSIS — N183 Chronic kidney disease, stage 3 (moderate): Secondary | ICD-10-CM | POA: Diagnosis not present

## 2016-02-28 DIAGNOSIS — N183 Chronic kidney disease, stage 3 (moderate): Secondary | ICD-10-CM | POA: Diagnosis not present

## 2016-02-28 DIAGNOSIS — R7302 Impaired glucose tolerance (oral): Secondary | ICD-10-CM | POA: Diagnosis not present

## 2016-02-28 DIAGNOSIS — J449 Chronic obstructive pulmonary disease, unspecified: Secondary | ICD-10-CM | POA: Diagnosis not present

## 2016-02-28 DIAGNOSIS — R030 Elevated blood-pressure reading, without diagnosis of hypertension: Secondary | ICD-10-CM | POA: Diagnosis not present

## 2016-02-28 DIAGNOSIS — E669 Obesity, unspecified: Secondary | ICD-10-CM | POA: Diagnosis not present

## 2016-02-28 DIAGNOSIS — N390 Urinary tract infection, site not specified: Secondary | ICD-10-CM | POA: Diagnosis not present

## 2016-02-28 DIAGNOSIS — R300 Dysuria: Secondary | ICD-10-CM | POA: Diagnosis not present

## 2016-02-28 DIAGNOSIS — R69 Illness, unspecified: Secondary | ICD-10-CM | POA: Diagnosis not present

## 2016-03-17 DIAGNOSIS — H25811 Combined forms of age-related cataract, right eye: Secondary | ICD-10-CM | POA: Diagnosis not present

## 2016-03-17 DIAGNOSIS — H25812 Combined forms of age-related cataract, left eye: Secondary | ICD-10-CM | POA: Diagnosis not present

## 2016-04-09 DIAGNOSIS — Z23 Encounter for immunization: Secondary | ICD-10-CM | POA: Diagnosis not present

## 2016-04-09 DIAGNOSIS — R300 Dysuria: Secondary | ICD-10-CM | POA: Diagnosis not present

## 2016-04-09 DIAGNOSIS — N390 Urinary tract infection, site not specified: Secondary | ICD-10-CM | POA: Diagnosis not present

## 2016-04-10 DIAGNOSIS — H25811 Combined forms of age-related cataract, right eye: Secondary | ICD-10-CM | POA: Diagnosis not present

## 2016-04-10 DIAGNOSIS — H2511 Age-related nuclear cataract, right eye: Secondary | ICD-10-CM | POA: Diagnosis not present

## 2016-04-21 DIAGNOSIS — L814 Other melanin hyperpigmentation: Secondary | ICD-10-CM | POA: Diagnosis not present

## 2016-04-21 DIAGNOSIS — L570 Actinic keratosis: Secondary | ICD-10-CM | POA: Diagnosis not present

## 2016-04-21 DIAGNOSIS — L821 Other seborrheic keratosis: Secondary | ICD-10-CM | POA: Diagnosis not present

## 2016-04-21 DIAGNOSIS — Z85828 Personal history of other malignant neoplasm of skin: Secondary | ICD-10-CM | POA: Diagnosis not present

## 2016-04-21 DIAGNOSIS — L720 Epidermal cyst: Secondary | ICD-10-CM | POA: Diagnosis not present

## 2016-04-21 DIAGNOSIS — L812 Freckles: Secondary | ICD-10-CM | POA: Diagnosis not present

## 2016-05-21 DIAGNOSIS — H2512 Age-related nuclear cataract, left eye: Secondary | ICD-10-CM | POA: Diagnosis not present

## 2016-05-21 DIAGNOSIS — H25812 Combined forms of age-related cataract, left eye: Secondary | ICD-10-CM | POA: Diagnosis not present

## 2016-05-30 DIAGNOSIS — E7800 Pure hypercholesterolemia, unspecified: Secondary | ICD-10-CM | POA: Diagnosis not present

## 2016-05-30 DIAGNOSIS — D692 Other nonthrombocytopenic purpura: Secondary | ICD-10-CM | POA: Diagnosis not present

## 2016-05-30 DIAGNOSIS — J449 Chronic obstructive pulmonary disease, unspecified: Secondary | ICD-10-CM | POA: Diagnosis not present

## 2016-05-30 DIAGNOSIS — Z0000 Encounter for general adult medical examination without abnormal findings: Secondary | ICD-10-CM | POA: Diagnosis not present

## 2016-05-30 DIAGNOSIS — E039 Hypothyroidism, unspecified: Secondary | ICD-10-CM | POA: Diagnosis not present

## 2016-06-02 DIAGNOSIS — D485 Neoplasm of uncertain behavior of skin: Secondary | ICD-10-CM | POA: Diagnosis not present

## 2016-06-02 DIAGNOSIS — C44622 Squamous cell carcinoma of skin of right upper limb, including shoulder: Secondary | ICD-10-CM | POA: Diagnosis not present

## 2016-06-02 DIAGNOSIS — Z85828 Personal history of other malignant neoplasm of skin: Secondary | ICD-10-CM | POA: Diagnosis not present

## 2016-06-18 DIAGNOSIS — Z85828 Personal history of other malignant neoplasm of skin: Secondary | ICD-10-CM | POA: Diagnosis not present

## 2016-06-18 DIAGNOSIS — C44622 Squamous cell carcinoma of skin of right upper limb, including shoulder: Secondary | ICD-10-CM | POA: Diagnosis not present

## 2016-07-01 DIAGNOSIS — Z4802 Encounter for removal of sutures: Secondary | ICD-10-CM | POA: Diagnosis not present

## 2016-07-03 DIAGNOSIS — R7302 Impaired glucose tolerance (oral): Secondary | ICD-10-CM | POA: Diagnosis not present

## 2016-07-03 DIAGNOSIS — N390 Urinary tract infection, site not specified: Secondary | ICD-10-CM | POA: Diagnosis not present

## 2016-07-03 DIAGNOSIS — E784 Other hyperlipidemia: Secondary | ICD-10-CM | POA: Diagnosis not present

## 2016-07-03 DIAGNOSIS — M810 Age-related osteoporosis without current pathological fracture: Secondary | ICD-10-CM | POA: Diagnosis not present

## 2016-07-03 DIAGNOSIS — R8299 Other abnormal findings in urine: Secondary | ICD-10-CM | POA: Diagnosis not present

## 2016-07-03 DIAGNOSIS — E038 Other specified hypothyroidism: Secondary | ICD-10-CM | POA: Diagnosis not present

## 2016-07-10 DIAGNOSIS — R69 Illness, unspecified: Secondary | ICD-10-CM | POA: Diagnosis not present

## 2016-07-10 DIAGNOSIS — J449 Chronic obstructive pulmonary disease, unspecified: Secondary | ICD-10-CM | POA: Diagnosis not present

## 2016-07-10 DIAGNOSIS — Z0000 Encounter for general adult medical examination without abnormal findings: Secondary | ICD-10-CM | POA: Diagnosis not present

## 2016-07-10 DIAGNOSIS — Z23 Encounter for immunization: Secondary | ICD-10-CM | POA: Diagnosis not present

## 2016-07-10 DIAGNOSIS — F17200 Nicotine dependence, unspecified, uncomplicated: Secondary | ICD-10-CM | POA: Diagnosis not present

## 2016-07-10 DIAGNOSIS — D692 Other nonthrombocytopenic purpura: Secondary | ICD-10-CM | POA: Diagnosis not present

## 2016-07-10 DIAGNOSIS — E784 Other hyperlipidemia: Secondary | ICD-10-CM | POA: Diagnosis not present

## 2016-07-10 DIAGNOSIS — R7302 Impaired glucose tolerance (oral): Secondary | ICD-10-CM | POA: Diagnosis not present

## 2016-07-10 DIAGNOSIS — E038 Other specified hypothyroidism: Secondary | ICD-10-CM | POA: Diagnosis not present

## 2016-07-10 DIAGNOSIS — I10 Essential (primary) hypertension: Secondary | ICD-10-CM | POA: Diagnosis not present

## 2016-07-10 DIAGNOSIS — F39 Unspecified mood [affective] disorder: Secondary | ICD-10-CM | POA: Insufficient documentation

## 2016-08-06 ENCOUNTER — Other Ambulatory Visit: Payer: Self-pay | Admitting: Internal Medicine

## 2016-08-06 ENCOUNTER — Ambulatory Visit
Admission: RE | Admit: 2016-08-06 | Discharge: 2016-08-06 | Disposition: A | Discharge status: Home / Self Care | Payer: Medicare HMO | Source: Ambulatory Visit | Attending: Internal Medicine | Admitting: Internal Medicine

## 2016-08-06 DIAGNOSIS — N183 Chronic kidney disease, stage 3 (moderate): Secondary | ICD-10-CM | POA: Diagnosis not present

## 2016-08-06 DIAGNOSIS — M810 Age-related osteoporosis without current pathological fracture: Secondary | ICD-10-CM

## 2016-08-06 DIAGNOSIS — M545 Low back pain, unspecified: Secondary | ICD-10-CM | POA: Insufficient documentation

## 2016-08-06 DIAGNOSIS — M546 Pain in thoracic spine: Secondary | ICD-10-CM | POA: Diagnosis not present

## 2016-08-06 DIAGNOSIS — E669 Obesity, unspecified: Secondary | ICD-10-CM | POA: Diagnosis not present

## 2016-08-06 DIAGNOSIS — M5136 Other intervertebral disc degeneration, lumbar region: Secondary | ICD-10-CM | POA: Diagnosis not present

## 2016-08-06 DIAGNOSIS — I10 Essential (primary) hypertension: Secondary | ICD-10-CM | POA: Diagnosis not present

## 2016-09-05 DIAGNOSIS — M545 Low back pain: Secondary | ICD-10-CM | POA: Diagnosis not present

## 2016-09-05 DIAGNOSIS — N390 Urinary tract infection, site not specified: Secondary | ICD-10-CM | POA: Diagnosis not present

## 2016-12-17 DIAGNOSIS — Z1231 Encounter for screening mammogram for malignant neoplasm of breast: Secondary | ICD-10-CM | POA: Diagnosis not present

## 2016-12-17 DIAGNOSIS — Z01419 Encounter for gynecological examination (general) (routine) without abnormal findings: Secondary | ICD-10-CM | POA: Diagnosis not present

## 2016-12-20 IMAGING — CR DG LUMBAR SPINE COMPLETE 4+V
5 series · 5 of 5 positions shown · non-contrast
Comparison: Radiographs November 23, 2007.

CLINICAL DATA: Lower back pain for several weeks.

EXAM:
LUMBAR SPINE - COMPLETE 4+ VIEW

[t l-spine a.p.]
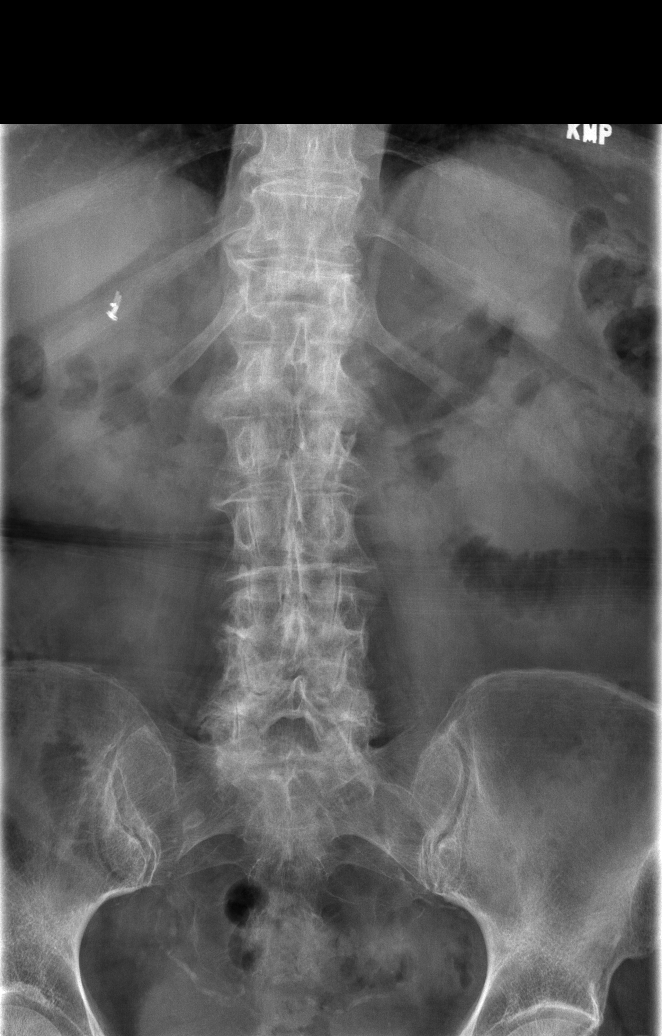

[t l-spine oblique exposure (1 of 2)]
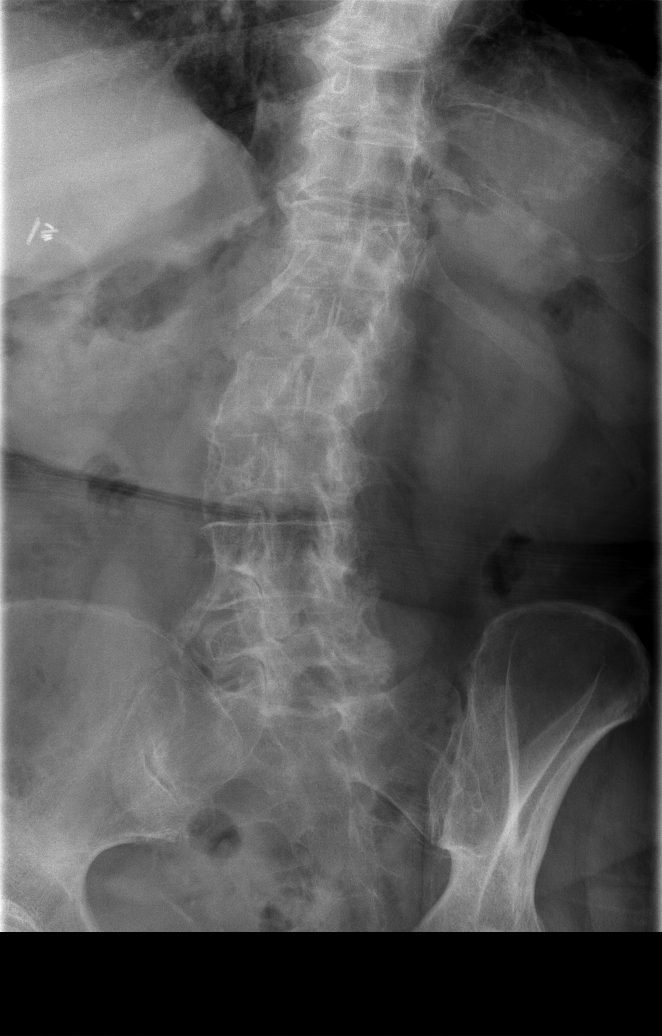

[t l-spine oblique exposure (2 of 2)]
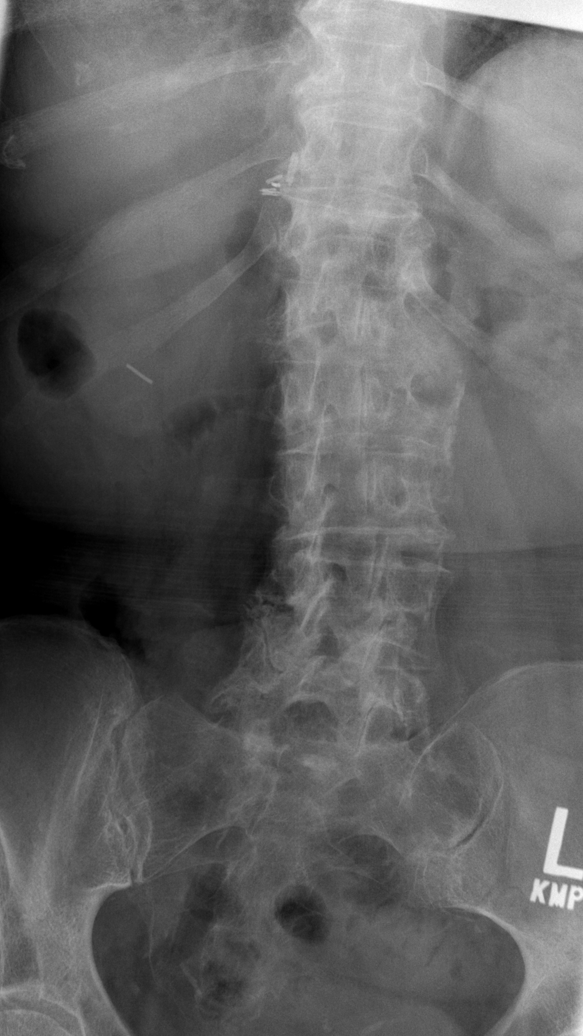

[t l-spine lat]
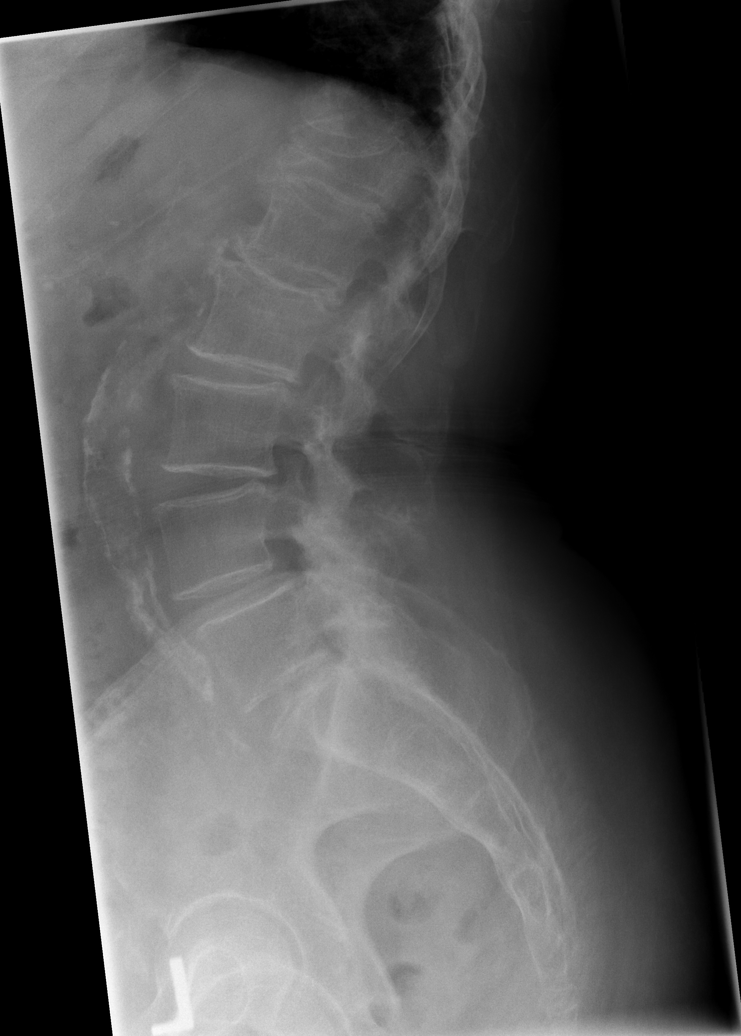

[t l-spine l5-s1 spot]
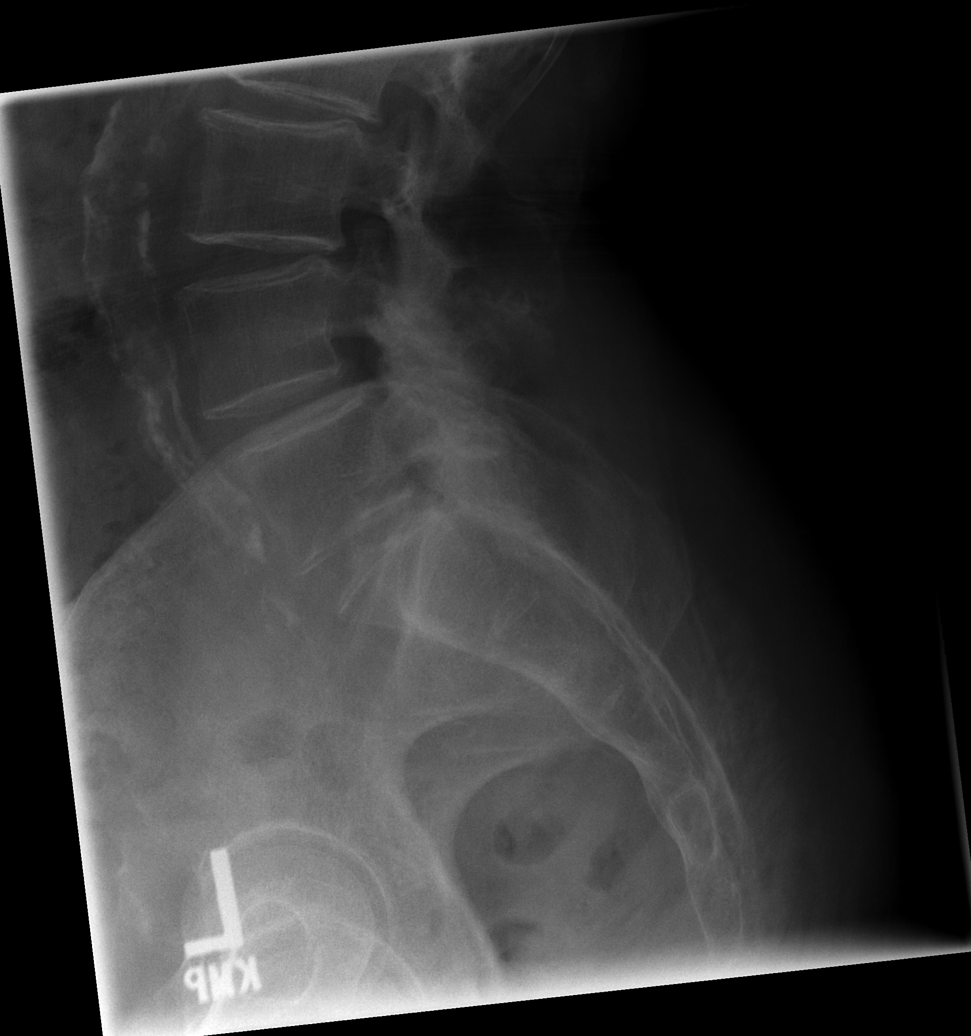

[5 of 5 positions shown; findings below may reference images not displayed]

FINDINGS: Stable old T12 fracture. No acute fracture is noted. Minimal grade 1
retrolisthesis of L1-2, L2-3 and L3-4 is noted. Mild degenerative
disc disease is noted at L1-2 and L2-3. Minimal grade 1
anterolisthesis of L4-5 is noted secondary to posterior facet joint
hypertrophy. Atherosclerosis of abdominal aorta is noted.
Degenerative changes seen involving posterior facet joints of L4-5
and L5-S1 bilaterally.
IMPRESSION: Stable T12 compression fracture. Multilevel degenerative disc
disease. No acute abnormality seen in the lumbar spine. Aortic
atherosclerosis.

## 2017-01-08 DIAGNOSIS — N390 Urinary tract infection, site not specified: Secondary | ICD-10-CM | POA: Diagnosis not present

## 2017-01-08 DIAGNOSIS — N183 Chronic kidney disease, stage 3 (moderate): Secondary | ICD-10-CM | POA: Diagnosis not present

## 2017-01-08 DIAGNOSIS — R69 Illness, unspecified: Secondary | ICD-10-CM | POA: Diagnosis not present

## 2017-01-08 DIAGNOSIS — I10 Essential (primary) hypertension: Secondary | ICD-10-CM | POA: Diagnosis not present

## 2017-01-08 DIAGNOSIS — E038 Other specified hypothyroidism: Secondary | ICD-10-CM | POA: Diagnosis not present

## 2017-01-08 DIAGNOSIS — M545 Low back pain: Secondary | ICD-10-CM | POA: Diagnosis not present

## 2017-01-08 DIAGNOSIS — R7302 Impaired glucose tolerance (oral): Secondary | ICD-10-CM | POA: Diagnosis not present

## 2017-01-08 DIAGNOSIS — M179 Osteoarthritis of knee, unspecified: Secondary | ICD-10-CM | POA: Diagnosis not present

## 2017-01-08 DIAGNOSIS — Z6836 Body mass index (BMI) 36.0-36.9, adult: Secondary | ICD-10-CM | POA: Diagnosis not present

## 2017-01-08 DIAGNOSIS — R300 Dysuria: Secondary | ICD-10-CM | POA: Diagnosis not present

## 2017-01-08 DIAGNOSIS — D692 Other nonthrombocytopenic purpura: Secondary | ICD-10-CM | POA: Diagnosis not present

## 2017-01-27 DIAGNOSIS — Z6835 Body mass index (BMI) 35.0-35.9, adult: Secondary | ICD-10-CM | POA: Diagnosis not present

## 2017-01-27 DIAGNOSIS — J449 Chronic obstructive pulmonary disease, unspecified: Secondary | ICD-10-CM | POA: Diagnosis not present

## 2017-01-27 DIAGNOSIS — J209 Acute bronchitis, unspecified: Secondary | ICD-10-CM | POA: Diagnosis not present

## 2017-01-27 DIAGNOSIS — R69 Illness, unspecified: Secondary | ICD-10-CM | POA: Diagnosis not present

## 2017-01-27 DIAGNOSIS — J309 Allergic rhinitis, unspecified: Secondary | ICD-10-CM | POA: Diagnosis not present

## 2017-02-12 DIAGNOSIS — C44622 Squamous cell carcinoma of skin of right upper limb, including shoulder: Secondary | ICD-10-CM | POA: Diagnosis not present

## 2017-02-12 DIAGNOSIS — D485 Neoplasm of uncertain behavior of skin: Secondary | ICD-10-CM | POA: Diagnosis not present

## 2017-02-12 DIAGNOSIS — L720 Epidermal cyst: Secondary | ICD-10-CM | POA: Diagnosis not present

## 2017-02-12 DIAGNOSIS — Z85828 Personal history of other malignant neoplasm of skin: Secondary | ICD-10-CM | POA: Diagnosis not present

## 2017-02-12 DIAGNOSIS — L821 Other seborrheic keratosis: Secondary | ICD-10-CM | POA: Diagnosis not present

## 2017-02-21 DIAGNOSIS — Z0000 Encounter for general adult medical examination without abnormal findings: Secondary | ICD-10-CM | POA: Diagnosis not present

## 2017-02-21 DIAGNOSIS — I10 Essential (primary) hypertension: Secondary | ICD-10-CM | POA: Diagnosis not present

## 2017-02-21 DIAGNOSIS — D692 Other nonthrombocytopenic purpura: Secondary | ICD-10-CM | POA: Diagnosis not present

## 2017-02-21 DIAGNOSIS — J449 Chronic obstructive pulmonary disease, unspecified: Secondary | ICD-10-CM | POA: Diagnosis not present

## 2017-02-21 DIAGNOSIS — E669 Obesity, unspecified: Secondary | ICD-10-CM | POA: Diagnosis not present

## 2017-02-21 DIAGNOSIS — E039 Hypothyroidism, unspecified: Secondary | ICD-10-CM | POA: Diagnosis not present

## 2017-02-21 DIAGNOSIS — E559 Vitamin D deficiency, unspecified: Secondary | ICD-10-CM | POA: Diagnosis not present

## 2017-02-21 DIAGNOSIS — Z6836 Body mass index (BMI) 36.0-36.9, adult: Secondary | ICD-10-CM | POA: Diagnosis not present

## 2017-02-21 DIAGNOSIS — R69 Illness, unspecified: Secondary | ICD-10-CM | POA: Diagnosis not present

## 2017-02-21 DIAGNOSIS — E7800 Pure hypercholesterolemia, unspecified: Secondary | ICD-10-CM | POA: Diagnosis not present

## 2017-03-10 DIAGNOSIS — Z86010 Personal history of colonic polyps: Secondary | ICD-10-CM | POA: Diagnosis not present

## 2017-03-10 DIAGNOSIS — Z1211 Encounter for screening for malignant neoplasm of colon: Secondary | ICD-10-CM | POA: Diagnosis not present

## 2017-03-10 DIAGNOSIS — K5730 Diverticulosis of large intestine without perforation or abscess without bleeding: Secondary | ICD-10-CM | POA: Diagnosis not present

## 2017-03-10 DIAGNOSIS — K219 Gastro-esophageal reflux disease without esophagitis: Secondary | ICD-10-CM | POA: Diagnosis not present

## 2017-03-11 DIAGNOSIS — M810 Age-related osteoporosis without current pathological fracture: Secondary | ICD-10-CM | POA: Diagnosis not present

## 2017-04-15 DIAGNOSIS — D122 Benign neoplasm of ascending colon: Secondary | ICD-10-CM | POA: Diagnosis not present

## 2017-04-15 DIAGNOSIS — K5730 Diverticulosis of large intestine without perforation or abscess without bleeding: Secondary | ICD-10-CM | POA: Diagnosis not present

## 2017-04-15 DIAGNOSIS — Z1211 Encounter for screening for malignant neoplasm of colon: Secondary | ICD-10-CM | POA: Diagnosis not present

## 2017-04-15 DIAGNOSIS — K635 Polyp of colon: Secondary | ICD-10-CM | POA: Diagnosis not present

## 2017-04-15 DIAGNOSIS — Z86010 Personal history of colonic polyps: Secondary | ICD-10-CM | POA: Diagnosis not present

## 2017-05-12 DIAGNOSIS — M810 Age-related osteoporosis without current pathological fracture: Secondary | ICD-10-CM | POA: Diagnosis not present

## 2017-05-12 DIAGNOSIS — M545 Low back pain: Secondary | ICD-10-CM | POA: Diagnosis not present

## 2017-05-20 ENCOUNTER — Other Ambulatory Visit: Payer: Self-pay | Admitting: Internal Medicine

## 2017-05-20 DIAGNOSIS — M545 Low back pain, unspecified: Secondary | ICD-10-CM

## 2017-05-30 ENCOUNTER — Other Ambulatory Visit: Payer: Medicare HMO

## 2017-05-30 ENCOUNTER — Ambulatory Visit
Admission: RE | Admit: 2017-05-30 | Discharge: 2017-05-30 | Disposition: A | Discharge status: Home / Self Care | Payer: Medicare HMO | Source: Ambulatory Visit | Attending: Internal Medicine | Admitting: Internal Medicine

## 2017-05-30 DIAGNOSIS — M48061 Spinal stenosis, lumbar region without neurogenic claudication: Secondary | ICD-10-CM | POA: Diagnosis not present

## 2017-05-30 DIAGNOSIS — M545 Low back pain, unspecified: Secondary | ICD-10-CM

## 2017-06-18 DIAGNOSIS — E668 Other obesity: Secondary | ICD-10-CM | POA: Diagnosis not present

## 2017-06-18 DIAGNOSIS — R7302 Impaired glucose tolerance (oral): Secondary | ICD-10-CM | POA: Diagnosis not present

## 2017-06-18 DIAGNOSIS — N183 Chronic kidney disease, stage 3 (moderate): Secondary | ICD-10-CM | POA: Diagnosis not present

## 2017-06-18 DIAGNOSIS — M545 Low back pain: Secondary | ICD-10-CM | POA: Diagnosis not present

## 2017-06-18 DIAGNOSIS — R82998 Other abnormal findings in urine: Secondary | ICD-10-CM | POA: Diagnosis not present

## 2017-06-18 DIAGNOSIS — K219 Gastro-esophageal reflux disease without esophagitis: Secondary | ICD-10-CM | POA: Insufficient documentation

## 2017-06-18 DIAGNOSIS — Z23 Encounter for immunization: Secondary | ICD-10-CM | POA: Diagnosis not present

## 2017-06-18 DIAGNOSIS — Z6836 Body mass index (BMI) 36.0-36.9, adult: Secondary | ICD-10-CM | POA: Diagnosis not present

## 2017-06-18 DIAGNOSIS — M810 Age-related osteoporosis without current pathological fracture: Secondary | ICD-10-CM | POA: Diagnosis not present

## 2017-06-18 DIAGNOSIS — I10 Essential (primary) hypertension: Secondary | ICD-10-CM | POA: Diagnosis not present

## 2017-06-18 DIAGNOSIS — N390 Urinary tract infection, site not specified: Secondary | ICD-10-CM | POA: Diagnosis not present

## 2017-06-26 DIAGNOSIS — H02831 Dermatochalasis of right upper eyelid: Secondary | ICD-10-CM | POA: Diagnosis not present

## 2017-06-26 DIAGNOSIS — H26493 Other secondary cataract, bilateral: Secondary | ICD-10-CM | POA: Diagnosis not present

## 2017-06-26 DIAGNOSIS — H353131 Nonexudative age-related macular degeneration, bilateral, early dry stage: Secondary | ICD-10-CM | POA: Diagnosis not present

## 2017-07-29 DIAGNOSIS — I10 Essential (primary) hypertension: Secondary | ICD-10-CM | POA: Diagnosis not present

## 2017-07-29 DIAGNOSIS — E7849 Other hyperlipidemia: Secondary | ICD-10-CM | POA: Diagnosis not present

## 2017-07-29 DIAGNOSIS — R7302 Impaired glucose tolerance (oral): Secondary | ICD-10-CM | POA: Diagnosis not present

## 2017-07-29 DIAGNOSIS — E559 Vitamin D deficiency, unspecified: Secondary | ICD-10-CM | POA: Diagnosis not present

## 2017-07-29 DIAGNOSIS — E038 Other specified hypothyroidism: Secondary | ICD-10-CM | POA: Diagnosis not present

## 2017-08-07 DIAGNOSIS — E7849 Other hyperlipidemia: Secondary | ICD-10-CM | POA: Diagnosis not present

## 2017-08-07 DIAGNOSIS — M545 Low back pain: Secondary | ICD-10-CM | POA: Diagnosis not present

## 2017-08-07 DIAGNOSIS — I10 Essential (primary) hypertension: Secondary | ICD-10-CM | POA: Diagnosis not present

## 2017-08-07 DIAGNOSIS — R69 Illness, unspecified: Secondary | ICD-10-CM | POA: Diagnosis not present

## 2017-08-07 DIAGNOSIS — N183 Chronic kidney disease, stage 3 (moderate): Secondary | ICD-10-CM | POA: Diagnosis not present

## 2017-08-07 DIAGNOSIS — R7302 Impaired glucose tolerance (oral): Secondary | ICD-10-CM | POA: Diagnosis not present

## 2017-08-07 DIAGNOSIS — Z0000 Encounter for general adult medical examination without abnormal findings: Secondary | ICD-10-CM | POA: Diagnosis not present

## 2017-08-07 DIAGNOSIS — E668 Other obesity: Secondary | ICD-10-CM | POA: Diagnosis not present

## 2017-08-07 DIAGNOSIS — E038 Other specified hypothyroidism: Secondary | ICD-10-CM | POA: Diagnosis not present

## 2017-08-07 DIAGNOSIS — J449 Chronic obstructive pulmonary disease, unspecified: Secondary | ICD-10-CM | POA: Diagnosis not present

## 2017-08-11 DIAGNOSIS — Z1212 Encounter for screening for malignant neoplasm of rectum: Secondary | ICD-10-CM | POA: Diagnosis not present

## 2017-09-15 DIAGNOSIS — E039 Hypothyroidism, unspecified: Secondary | ICD-10-CM | POA: Diagnosis not present

## 2017-09-15 DIAGNOSIS — I10 Essential (primary) hypertension: Secondary | ICD-10-CM | POA: Diagnosis not present

## 2017-09-15 DIAGNOSIS — R69 Illness, unspecified: Secondary | ICD-10-CM | POA: Diagnosis not present

## 2017-09-15 DIAGNOSIS — K08109 Complete loss of teeth, unspecified cause, unspecified class: Secondary | ICD-10-CM | POA: Diagnosis not present

## 2017-09-15 DIAGNOSIS — Z833 Family history of diabetes mellitus: Secondary | ICD-10-CM | POA: Diagnosis not present

## 2017-09-15 DIAGNOSIS — Z791 Long term (current) use of non-steroidal anti-inflammatories (NSAID): Secondary | ICD-10-CM | POA: Diagnosis not present

## 2017-09-15 DIAGNOSIS — E669 Obesity, unspecified: Secondary | ICD-10-CM | POA: Diagnosis not present

## 2017-09-15 DIAGNOSIS — Z809 Family history of malignant neoplasm, unspecified: Secondary | ICD-10-CM | POA: Diagnosis not present

## 2017-09-15 DIAGNOSIS — R32 Unspecified urinary incontinence: Secondary | ICD-10-CM | POA: Diagnosis not present

## 2017-09-15 DIAGNOSIS — Z8249 Family history of ischemic heart disease and other diseases of the circulatory system: Secondary | ICD-10-CM | POA: Diagnosis not present

## 2017-10-07 DIAGNOSIS — I10 Essential (primary) hypertension: Secondary | ICD-10-CM | POA: Diagnosis not present

## 2017-10-07 DIAGNOSIS — M5416 Radiculopathy, lumbar region: Secondary | ICD-10-CM | POA: Diagnosis not present

## 2017-10-07 DIAGNOSIS — M5489 Other dorsalgia: Secondary | ICD-10-CM | POA: Diagnosis not present

## 2017-10-07 DIAGNOSIS — M545 Low back pain: Secondary | ICD-10-CM | POA: Diagnosis not present

## 2017-10-07 DIAGNOSIS — Z6836 Body mass index (BMI) 36.0-36.9, adult: Secondary | ICD-10-CM | POA: Diagnosis not present

## 2017-10-07 DIAGNOSIS — M4316 Spondylolisthesis, lumbar region: Secondary | ICD-10-CM | POA: Diagnosis not present

## 2017-10-07 DIAGNOSIS — M9983 Other biomechanical lesions of lumbar region: Secondary | ICD-10-CM | POA: Diagnosis not present

## 2017-10-22 DIAGNOSIS — M549 Dorsalgia, unspecified: Secondary | ICD-10-CM | POA: Diagnosis not present

## 2017-10-30 DIAGNOSIS — R102 Pelvic and perineal pain: Secondary | ICD-10-CM | POA: Diagnosis not present

## 2017-12-09 DIAGNOSIS — J449 Chronic obstructive pulmonary disease, unspecified: Secondary | ICD-10-CM | POA: Diagnosis not present

## 2017-12-09 DIAGNOSIS — Z6835 Body mass index (BMI) 35.0-35.9, adult: Secondary | ICD-10-CM | POA: Diagnosis not present

## 2017-12-09 DIAGNOSIS — R0781 Pleurodynia: Secondary | ICD-10-CM | POA: Insufficient documentation

## 2017-12-09 DIAGNOSIS — S2239XA Fracture of one rib, unspecified side, initial encounter for closed fracture: Secondary | ICD-10-CM | POA: Insufficient documentation

## 2017-12-09 DIAGNOSIS — I10 Essential (primary) hypertension: Secondary | ICD-10-CM | POA: Diagnosis not present

## 2018-02-03 DIAGNOSIS — N183 Chronic kidney disease, stage 3 (moderate): Secondary | ICD-10-CM | POA: Diagnosis not present

## 2018-02-03 DIAGNOSIS — I10 Essential (primary) hypertension: Secondary | ICD-10-CM | POA: Diagnosis not present

## 2018-02-03 DIAGNOSIS — R7302 Impaired glucose tolerance (oral): Secondary | ICD-10-CM | POA: Diagnosis not present

## 2018-06-05 DIAGNOSIS — R69 Illness, unspecified: Secondary | ICD-10-CM | POA: Diagnosis not present

## 2018-06-29 DIAGNOSIS — H02831 Dermatochalasis of right upper eyelid: Secondary | ICD-10-CM | POA: Diagnosis not present

## 2018-06-29 DIAGNOSIS — H353131 Nonexudative age-related macular degeneration, bilateral, early dry stage: Secondary | ICD-10-CM | POA: Diagnosis not present

## 2018-06-29 DIAGNOSIS — H26493 Other secondary cataract, bilateral: Secondary | ICD-10-CM | POA: Diagnosis not present

## 2018-07-28 DIAGNOSIS — M545 Low back pain: Secondary | ICD-10-CM | POA: Diagnosis not present

## 2018-07-28 DIAGNOSIS — G8929 Other chronic pain: Secondary | ICD-10-CM | POA: Insufficient documentation

## 2018-07-28 DIAGNOSIS — R8290 Unspecified abnormal findings in urine: Secondary | ICD-10-CM | POA: Diagnosis not present

## 2018-07-28 DIAGNOSIS — R8279 Other abnormal findings on microbiological examination of urine: Secondary | ICD-10-CM | POA: Insufficient documentation

## 2018-07-28 DIAGNOSIS — N390 Urinary tract infection, site not specified: Secondary | ICD-10-CM | POA: Insufficient documentation

## 2018-07-28 DIAGNOSIS — I10 Essential (primary) hypertension: Secondary | ICD-10-CM | POA: Diagnosis not present

## 2018-09-06 DIAGNOSIS — R7302 Impaired glucose tolerance (oral): Secondary | ICD-10-CM | POA: Diagnosis not present

## 2018-09-06 DIAGNOSIS — I10 Essential (primary) hypertension: Secondary | ICD-10-CM | POA: Diagnosis not present

## 2018-09-06 DIAGNOSIS — E7849 Other hyperlipidemia: Secondary | ICD-10-CM | POA: Diagnosis not present

## 2018-09-06 DIAGNOSIS — M810 Age-related osteoporosis without current pathological fracture: Secondary | ICD-10-CM | POA: Diagnosis not present

## 2018-09-06 DIAGNOSIS — E038 Other specified hypothyroidism: Secondary | ICD-10-CM | POA: Diagnosis not present

## 2018-09-13 DIAGNOSIS — J449 Chronic obstructive pulmonary disease, unspecified: Secondary | ICD-10-CM | POA: Diagnosis not present

## 2018-09-13 DIAGNOSIS — Z0000 Encounter for general adult medical examination without abnormal findings: Secondary | ICD-10-CM | POA: Diagnosis not present

## 2018-09-13 DIAGNOSIS — R05 Cough: Secondary | ICD-10-CM | POA: Diagnosis not present

## 2018-09-13 DIAGNOSIS — R059 Cough, unspecified: Secondary | ICD-10-CM | POA: Insufficient documentation

## 2018-09-13 DIAGNOSIS — E668 Other obesity: Secondary | ICD-10-CM | POA: Diagnosis not present

## 2018-09-13 DIAGNOSIS — E7849 Other hyperlipidemia: Secondary | ICD-10-CM | POA: Diagnosis not present

## 2018-09-13 DIAGNOSIS — R69 Illness, unspecified: Secondary | ICD-10-CM | POA: Diagnosis not present

## 2018-09-13 DIAGNOSIS — E038 Other specified hypothyroidism: Secondary | ICD-10-CM | POA: Diagnosis not present

## 2018-09-13 DIAGNOSIS — I10 Essential (primary) hypertension: Secondary | ICD-10-CM | POA: Diagnosis not present

## 2018-09-13 DIAGNOSIS — N183 Chronic kidney disease, stage 3 (moderate): Secondary | ICD-10-CM | POA: Diagnosis not present

## 2018-09-13 DIAGNOSIS — R7302 Impaired glucose tolerance (oral): Secondary | ICD-10-CM | POA: Diagnosis not present

## 2018-09-15 DIAGNOSIS — Z1212 Encounter for screening for malignant neoplasm of rectum: Secondary | ICD-10-CM | POA: Diagnosis not present

## 2018-11-09 ENCOUNTER — Other Ambulatory Visit: Payer: Self-pay | Admitting: Obstetrics & Gynecology

## 2018-11-09 DIAGNOSIS — R319 Hematuria, unspecified: Secondary | ICD-10-CM | POA: Diagnosis not present

## 2018-11-09 DIAGNOSIS — R102 Pelvic and perineal pain: Secondary | ICD-10-CM

## 2018-11-09 DIAGNOSIS — R1032 Left lower quadrant pain: Secondary | ICD-10-CM | POA: Diagnosis not present

## 2018-11-16 ENCOUNTER — Ambulatory Visit
Admission: RE | Admit: 2018-11-16 | Discharge: 2018-11-16 | Disposition: A | Discharge status: Home / Self Care | Payer: Medicare HMO | Source: Ambulatory Visit | Attending: Obstetrics & Gynecology | Admitting: Obstetrics & Gynecology

## 2018-11-16 DIAGNOSIS — R1032 Left lower quadrant pain: Secondary | ICD-10-CM | POA: Diagnosis not present

## 2018-11-16 DIAGNOSIS — R102 Pelvic and perineal pain: Secondary | ICD-10-CM

## 2018-11-16 MED ORDER — IOPAMIDOL (ISOVUE-300) INJECTION 61%
100.0000 mL | Freq: Once | INTRAVENOUS | Status: AC | PRN
Start: 2018-11-16 — End: 2018-11-16
  Administered 2018-11-16: 100 mL via INTRAVENOUS

## 2018-12-22 DIAGNOSIS — R69 Illness, unspecified: Secondary | ICD-10-CM | POA: Diagnosis not present

## 2018-12-22 DIAGNOSIS — I129 Hypertensive chronic kidney disease with stage 1 through stage 4 chronic kidney disease, or unspecified chronic kidney disease: Secondary | ICD-10-CM | POA: Insufficient documentation

## 2018-12-22 DIAGNOSIS — R7302 Impaired glucose tolerance (oral): Secondary | ICD-10-CM | POA: Diagnosis not present

## 2018-12-22 DIAGNOSIS — E669 Obesity, unspecified: Secondary | ICD-10-CM | POA: Diagnosis not present

## 2018-12-22 DIAGNOSIS — G8929 Other chronic pain: Secondary | ICD-10-CM | POA: Diagnosis not present

## 2018-12-22 DIAGNOSIS — R8290 Unspecified abnormal findings in urine: Secondary | ICD-10-CM | POA: Diagnosis not present

## 2018-12-22 DIAGNOSIS — N183 Chronic kidney disease, stage 3 (moderate): Secondary | ICD-10-CM | POA: Diagnosis not present

## 2018-12-23 DIAGNOSIS — R8290 Unspecified abnormal findings in urine: Secondary | ICD-10-CM | POA: Diagnosis not present

## 2018-12-23 DIAGNOSIS — I10 Essential (primary) hypertension: Secondary | ICD-10-CM | POA: Diagnosis not present

## 2018-12-23 DIAGNOSIS — N390 Urinary tract infection, site not specified: Secondary | ICD-10-CM | POA: Diagnosis not present

## 2019-04-14 DIAGNOSIS — R69 Illness, unspecified: Secondary | ICD-10-CM | POA: Diagnosis not present

## 2019-04-28 DIAGNOSIS — R7302 Impaired glucose tolerance (oral): Secondary | ICD-10-CM | POA: Diagnosis not present

## 2019-04-28 DIAGNOSIS — N183 Chronic kidney disease, stage 3 (moderate): Secondary | ICD-10-CM | POA: Diagnosis not present

## 2019-04-28 DIAGNOSIS — G8929 Other chronic pain: Secondary | ICD-10-CM | POA: Diagnosis not present

## 2019-04-28 DIAGNOSIS — E669 Obesity, unspecified: Secondary | ICD-10-CM | POA: Diagnosis not present

## 2019-04-28 DIAGNOSIS — N390 Urinary tract infection, site not specified: Secondary | ICD-10-CM | POA: Diagnosis not present

## 2019-04-28 DIAGNOSIS — R69 Illness, unspecified: Secondary | ICD-10-CM | POA: Diagnosis not present

## 2019-04-28 DIAGNOSIS — I129 Hypertensive chronic kidney disease with stage 1 through stage 4 chronic kidney disease, or unspecified chronic kidney disease: Secondary | ICD-10-CM | POA: Diagnosis not present

## 2019-05-03 DIAGNOSIS — R82998 Other abnormal findings in urine: Secondary | ICD-10-CM | POA: Diagnosis not present

## 2019-05-03 DIAGNOSIS — R7302 Impaired glucose tolerance (oral): Secondary | ICD-10-CM | POA: Diagnosis not present

## 2019-05-03 DIAGNOSIS — N183 Chronic kidney disease, stage 3 (moderate): Secondary | ICD-10-CM | POA: Diagnosis not present

## 2019-07-19 DIAGNOSIS — E039 Hypothyroidism, unspecified: Secondary | ICD-10-CM | POA: Diagnosis not present

## 2019-07-19 DIAGNOSIS — R69 Illness, unspecified: Secondary | ICD-10-CM | POA: Diagnosis not present

## 2019-07-19 DIAGNOSIS — Z809 Family history of malignant neoplasm, unspecified: Secondary | ICD-10-CM | POA: Diagnosis not present

## 2019-07-19 DIAGNOSIS — Z6833 Body mass index (BMI) 33.0-33.9, adult: Secondary | ICD-10-CM | POA: Diagnosis not present

## 2019-07-19 DIAGNOSIS — Z008 Encounter for other general examination: Secondary | ICD-10-CM | POA: Diagnosis not present

## 2019-07-19 DIAGNOSIS — I739 Peripheral vascular disease, unspecified: Secondary | ICD-10-CM | POA: Diagnosis not present

## 2019-07-19 DIAGNOSIS — I10 Essential (primary) hypertension: Secondary | ICD-10-CM | POA: Diagnosis not present

## 2019-07-19 DIAGNOSIS — E669 Obesity, unspecified: Secondary | ICD-10-CM | POA: Diagnosis not present

## 2019-07-19 DIAGNOSIS — M1990 Unspecified osteoarthritis, unspecified site: Secondary | ICD-10-CM | POA: Diagnosis not present

## 2019-07-19 DIAGNOSIS — Z825 Family history of asthma and other chronic lower respiratory diseases: Secondary | ICD-10-CM | POA: Diagnosis not present

## 2019-07-19 DIAGNOSIS — Z8249 Family history of ischemic heart disease and other diseases of the circulatory system: Secondary | ICD-10-CM | POA: Diagnosis not present

## 2019-08-02 DIAGNOSIS — H353131 Nonexudative age-related macular degeneration, bilateral, early dry stage: Secondary | ICD-10-CM | POA: Diagnosis not present

## 2019-08-02 DIAGNOSIS — H02831 Dermatochalasis of right upper eyelid: Secondary | ICD-10-CM | POA: Diagnosis not present

## 2019-08-02 DIAGNOSIS — H26493 Other secondary cataract, bilateral: Secondary | ICD-10-CM | POA: Diagnosis not present

## 2019-09-19 DIAGNOSIS — J189 Pneumonia, unspecified organism: Secondary | ICD-10-CM | POA: Insufficient documentation

## 2019-09-19 DIAGNOSIS — R05 Cough: Secondary | ICD-10-CM | POA: Diagnosis not present

## 2019-09-19 DIAGNOSIS — Z20822 Contact with and (suspected) exposure to covid-19: Secondary | ICD-10-CM | POA: Insufficient documentation

## 2019-09-19 DIAGNOSIS — N1832 Chronic kidney disease, stage 3b: Secondary | ICD-10-CM | POA: Diagnosis not present

## 2019-09-19 DIAGNOSIS — I129 Hypertensive chronic kidney disease with stage 1 through stage 4 chronic kidney disease, or unspecified chronic kidney disease: Secondary | ICD-10-CM | POA: Diagnosis not present

## 2019-09-19 DIAGNOSIS — R69 Illness, unspecified: Secondary | ICD-10-CM | POA: Diagnosis not present

## 2019-09-19 DIAGNOSIS — J449 Chronic obstructive pulmonary disease, unspecified: Secondary | ICD-10-CM | POA: Diagnosis not present

## 2019-09-19 DIAGNOSIS — Z20828 Contact with and (suspected) exposure to other viral communicable diseases: Secondary | ICD-10-CM | POA: Insufficient documentation

## 2019-09-22 DIAGNOSIS — E7849 Other hyperlipidemia: Secondary | ICD-10-CM | POA: Diagnosis not present

## 2019-09-22 DIAGNOSIS — E038 Other specified hypothyroidism: Secondary | ICD-10-CM | POA: Diagnosis not present

## 2019-09-22 DIAGNOSIS — M810 Age-related osteoporosis without current pathological fracture: Secondary | ICD-10-CM | POA: Diagnosis not present

## 2019-09-22 DIAGNOSIS — R7302 Impaired glucose tolerance (oral): Secondary | ICD-10-CM | POA: Diagnosis not present

## 2019-09-23 DIAGNOSIS — R7989 Other specified abnormal findings of blood chemistry: Secondary | ICD-10-CM | POA: Diagnosis not present

## 2019-09-26 DIAGNOSIS — R82998 Other abnormal findings in urine: Secondary | ICD-10-CM | POA: Diagnosis not present

## 2019-09-30 DIAGNOSIS — N1832 Chronic kidney disease, stage 3b: Secondary | ICD-10-CM | POA: Diagnosis not present

## 2019-09-30 DIAGNOSIS — J189 Pneumonia, unspecified organism: Secondary | ICD-10-CM | POA: Diagnosis not present

## 2019-09-30 DIAGNOSIS — Z0000 Encounter for general adult medical examination without abnormal findings: Secondary | ICD-10-CM | POA: Diagnosis not present

## 2019-09-30 DIAGNOSIS — J449 Chronic obstructive pulmonary disease, unspecified: Secondary | ICD-10-CM | POA: Diagnosis not present

## 2019-09-30 DIAGNOSIS — E669 Obesity, unspecified: Secondary | ICD-10-CM | POA: Diagnosis not present

## 2019-09-30 DIAGNOSIS — E785 Hyperlipidemia, unspecified: Secondary | ICD-10-CM | POA: Diagnosis not present

## 2019-09-30 DIAGNOSIS — E039 Hypothyroidism, unspecified: Secondary | ICD-10-CM | POA: Diagnosis not present

## 2019-09-30 DIAGNOSIS — I129 Hypertensive chronic kidney disease with stage 1 through stage 4 chronic kidney disease, or unspecified chronic kidney disease: Secondary | ICD-10-CM | POA: Diagnosis not present

## 2019-09-30 DIAGNOSIS — Z1331 Encounter for screening for depression: Secondary | ICD-10-CM | POA: Diagnosis not present

## 2019-09-30 DIAGNOSIS — R69 Illness, unspecified: Secondary | ICD-10-CM | POA: Diagnosis not present

## 2019-09-30 DIAGNOSIS — R7302 Impaired glucose tolerance (oral): Secondary | ICD-10-CM | POA: Diagnosis not present

## 2019-10-04 DIAGNOSIS — J189 Pneumonia, unspecified organism: Secondary | ICD-10-CM | POA: Diagnosis not present

## 2019-10-20 DIAGNOSIS — R05 Cough: Secondary | ICD-10-CM | POA: Diagnosis not present

## 2019-10-20 DIAGNOSIS — Z9189 Other specified personal risk factors, not elsewhere classified: Secondary | ICD-10-CM | POA: Insufficient documentation

## 2019-10-20 DIAGNOSIS — J449 Chronic obstructive pulmonary disease, unspecified: Secondary | ICD-10-CM | POA: Diagnosis not present

## 2019-10-20 DIAGNOSIS — Z20818 Contact with and (suspected) exposure to other bacterial communicable diseases: Secondary | ICD-10-CM | POA: Diagnosis not present

## 2019-10-20 DIAGNOSIS — IMO0001 Reserved for inherently not codable concepts without codable children: Secondary | ICD-10-CM | POA: Insufficient documentation

## 2020-01-20 DIAGNOSIS — Z20818 Contact with and (suspected) exposure to other bacterial communicable diseases: Secondary | ICD-10-CM | POA: Diagnosis not present

## 2020-01-20 DIAGNOSIS — J449 Chronic obstructive pulmonary disease, unspecified: Secondary | ICD-10-CM | POA: Diagnosis not present

## 2020-01-20 DIAGNOSIS — R05 Cough: Secondary | ICD-10-CM | POA: Diagnosis not present

## 2020-01-20 DIAGNOSIS — R69 Illness, unspecified: Secondary | ICD-10-CM | POA: Diagnosis not present

## 2020-01-20 DIAGNOSIS — J189 Pneumonia, unspecified organism: Secondary | ICD-10-CM | POA: Diagnosis not present

## 2020-02-01 DIAGNOSIS — E038 Other specified hypothyroidism: Secondary | ICD-10-CM | POA: Diagnosis not present

## 2020-02-01 DIAGNOSIS — E669 Obesity, unspecified: Secondary | ICD-10-CM | POA: Diagnosis not present

## 2020-02-01 DIAGNOSIS — R69 Illness, unspecified: Secondary | ICD-10-CM | POA: Diagnosis not present

## 2020-02-01 DIAGNOSIS — N1832 Chronic kidney disease, stage 3b: Secondary | ICD-10-CM | POA: Diagnosis not present

## 2020-02-01 DIAGNOSIS — R7302 Impaired glucose tolerance (oral): Secondary | ICD-10-CM | POA: Diagnosis not present

## 2020-02-01 DIAGNOSIS — N390 Urinary tract infection, site not specified: Secondary | ICD-10-CM | POA: Diagnosis not present

## 2020-02-01 DIAGNOSIS — I129 Hypertensive chronic kidney disease with stage 1 through stage 4 chronic kidney disease, or unspecified chronic kidney disease: Secondary | ICD-10-CM | POA: Diagnosis not present

## 2020-02-15 DIAGNOSIS — H60509 Unspecified acute noninfective otitis externa, unspecified ear: Secondary | ICD-10-CM | POA: Insufficient documentation

## 2020-02-15 DIAGNOSIS — N1832 Chronic kidney disease, stage 3b: Secondary | ICD-10-CM | POA: Diagnosis not present

## 2020-02-15 DIAGNOSIS — H60502 Unspecified acute noninfective otitis externa, left ear: Secondary | ICD-10-CM | POA: Diagnosis not present

## 2020-02-15 DIAGNOSIS — J309 Allergic rhinitis, unspecified: Secondary | ICD-10-CM | POA: Diagnosis not present

## 2020-02-15 DIAGNOSIS — R69 Illness, unspecified: Secondary | ICD-10-CM | POA: Diagnosis not present

## 2020-02-15 DIAGNOSIS — I129 Hypertensive chronic kidney disease with stage 1 through stage 4 chronic kidney disease, or unspecified chronic kidney disease: Secondary | ICD-10-CM | POA: Diagnosis not present

## 2020-02-23 DIAGNOSIS — E669 Obesity, unspecified: Secondary | ICD-10-CM | POA: Diagnosis not present

## 2020-02-23 DIAGNOSIS — G8929 Other chronic pain: Secondary | ICD-10-CM | POA: Diagnosis not present

## 2020-02-23 DIAGNOSIS — E039 Hypothyroidism, unspecified: Secondary | ICD-10-CM | POA: Diagnosis not present

## 2020-02-23 DIAGNOSIS — K219 Gastro-esophageal reflux disease without esophagitis: Secondary | ICD-10-CM | POA: Diagnosis not present

## 2020-02-23 DIAGNOSIS — M069 Rheumatoid arthritis, unspecified: Secondary | ICD-10-CM | POA: Diagnosis not present

## 2020-02-23 DIAGNOSIS — I10 Essential (primary) hypertension: Secondary | ICD-10-CM | POA: Diagnosis not present

## 2020-02-23 DIAGNOSIS — Z008 Encounter for other general examination: Secondary | ICD-10-CM | POA: Diagnosis not present

## 2020-02-23 DIAGNOSIS — R69 Illness, unspecified: Secondary | ICD-10-CM | POA: Diagnosis not present

## 2020-02-23 DIAGNOSIS — I739 Peripheral vascular disease, unspecified: Secondary | ICD-10-CM | POA: Diagnosis not present

## 2020-02-23 DIAGNOSIS — H6690 Otitis media, unspecified, unspecified ear: Secondary | ICD-10-CM | POA: Diagnosis not present

## 2020-02-23 DIAGNOSIS — J449 Chronic obstructive pulmonary disease, unspecified: Secondary | ICD-10-CM | POA: Diagnosis not present

## 2020-03-14 DIAGNOSIS — D485 Neoplasm of uncertain behavior of skin: Secondary | ICD-10-CM | POA: Diagnosis not present

## 2020-03-14 DIAGNOSIS — D0471 Carcinoma in situ of skin of right lower limb, including hip: Secondary | ICD-10-CM | POA: Diagnosis not present

## 2020-03-14 DIAGNOSIS — L821 Other seborrheic keratosis: Secondary | ICD-10-CM | POA: Diagnosis not present

## 2020-03-14 DIAGNOSIS — D0472 Carcinoma in situ of skin of left lower limb, including hip: Secondary | ICD-10-CM | POA: Diagnosis not present

## 2020-03-14 DIAGNOSIS — Z85828 Personal history of other malignant neoplasm of skin: Secondary | ICD-10-CM | POA: Diagnosis not present

## 2020-03-14 DIAGNOSIS — L820 Inflamed seborrheic keratosis: Secondary | ICD-10-CM | POA: Diagnosis not present

## 2020-05-07 DIAGNOSIS — E785 Hyperlipidemia, unspecified: Secondary | ICD-10-CM | POA: Diagnosis not present

## 2020-05-07 DIAGNOSIS — R69 Illness, unspecified: Secondary | ICD-10-CM | POA: Diagnosis not present

## 2020-05-07 DIAGNOSIS — I129 Hypertensive chronic kidney disease with stage 1 through stage 4 chronic kidney disease, or unspecified chronic kidney disease: Secondary | ICD-10-CM | POA: Diagnosis not present

## 2020-05-07 DIAGNOSIS — E039 Hypothyroidism, unspecified: Secondary | ICD-10-CM | POA: Diagnosis not present

## 2020-05-07 DIAGNOSIS — N1832 Chronic kidney disease, stage 3b: Secondary | ICD-10-CM | POA: Diagnosis not present

## 2020-07-05 DIAGNOSIS — C44722 Squamous cell carcinoma of skin of right lower limb, including hip: Secondary | ICD-10-CM | POA: Diagnosis not present

## 2020-07-05 DIAGNOSIS — L570 Actinic keratosis: Secondary | ICD-10-CM | POA: Diagnosis not present

## 2020-07-05 DIAGNOSIS — Z85828 Personal history of other malignant neoplasm of skin: Secondary | ICD-10-CM | POA: Diagnosis not present

## 2020-07-05 DIAGNOSIS — D485 Neoplasm of uncertain behavior of skin: Secondary | ICD-10-CM | POA: Diagnosis not present

## 2020-07-17 DIAGNOSIS — L850 Acquired ichthyosis: Secondary | ICD-10-CM | POA: Diagnosis not present

## 2020-07-17 DIAGNOSIS — Z85828 Personal history of other malignant neoplasm of skin: Secondary | ICD-10-CM | POA: Diagnosis not present

## 2020-07-17 DIAGNOSIS — L821 Other seborrheic keratosis: Secondary | ICD-10-CM | POA: Diagnosis not present

## 2020-08-07 DIAGNOSIS — H353131 Nonexudative age-related macular degeneration, bilateral, early dry stage: Secondary | ICD-10-CM | POA: Diagnosis not present

## 2020-08-07 DIAGNOSIS — H18523 Epithelial (juvenile) corneal dystrophy, bilateral: Secondary | ICD-10-CM | POA: Diagnosis not present

## 2020-08-11 DIAGNOSIS — R69 Illness, unspecified: Secondary | ICD-10-CM | POA: Diagnosis not present

## 2020-09-26 DIAGNOSIS — C44722 Squamous cell carcinoma of skin of right lower limb, including hip: Secondary | ICD-10-CM | POA: Diagnosis not present

## 2020-09-26 DIAGNOSIS — Z85828 Personal history of other malignant neoplasm of skin: Secondary | ICD-10-CM | POA: Diagnosis not present

## 2020-10-04 DIAGNOSIS — R7302 Impaired glucose tolerance (oral): Secondary | ICD-10-CM | POA: Diagnosis not present

## 2020-10-04 DIAGNOSIS — E039 Hypothyroidism, unspecified: Secondary | ICD-10-CM | POA: Diagnosis not present

## 2020-10-04 DIAGNOSIS — M810 Age-related osteoporosis without current pathological fracture: Secondary | ICD-10-CM | POA: Diagnosis not present

## 2020-10-04 DIAGNOSIS — E785 Hyperlipidemia, unspecified: Secondary | ICD-10-CM | POA: Diagnosis not present

## 2020-10-08 DIAGNOSIS — C44722 Squamous cell carcinoma of skin of right lower limb, including hip: Secondary | ICD-10-CM | POA: Diagnosis not present

## 2020-10-11 DIAGNOSIS — E669 Obesity, unspecified: Secondary | ICD-10-CM | POA: Diagnosis not present

## 2020-10-11 DIAGNOSIS — R7302 Impaired glucose tolerance (oral): Secondary | ICD-10-CM | POA: Diagnosis not present

## 2020-10-11 DIAGNOSIS — R69 Illness, unspecified: Secondary | ICD-10-CM | POA: Diagnosis not present

## 2020-10-11 DIAGNOSIS — E039 Hypothyroidism, unspecified: Secondary | ICD-10-CM | POA: Diagnosis not present

## 2020-10-11 DIAGNOSIS — R82998 Other abnormal findings in urine: Secondary | ICD-10-CM | POA: Diagnosis not present

## 2020-10-11 DIAGNOSIS — J449 Chronic obstructive pulmonary disease, unspecified: Secondary | ICD-10-CM | POA: Diagnosis not present

## 2020-10-11 DIAGNOSIS — N1832 Chronic kidney disease, stage 3b: Secondary | ICD-10-CM | POA: Diagnosis not present

## 2020-10-11 DIAGNOSIS — Z0000 Encounter for general adult medical examination without abnormal findings: Secondary | ICD-10-CM | POA: Diagnosis not present

## 2020-10-11 DIAGNOSIS — I129 Hypertensive chronic kidney disease with stage 1 through stage 4 chronic kidney disease, or unspecified chronic kidney disease: Secondary | ICD-10-CM | POA: Diagnosis not present

## 2020-10-11 DIAGNOSIS — E785 Hyperlipidemia, unspecified: Secondary | ICD-10-CM | POA: Diagnosis not present

## 2020-10-22 DIAGNOSIS — Z85828 Personal history of other malignant neoplasm of skin: Secondary | ICD-10-CM | POA: Diagnosis not present

## 2020-10-22 DIAGNOSIS — L821 Other seborrheic keratosis: Secondary | ICD-10-CM | POA: Diagnosis not present

## 2020-10-22 DIAGNOSIS — L570 Actinic keratosis: Secondary | ICD-10-CM | POA: Diagnosis not present

## 2020-10-22 DIAGNOSIS — L820 Inflamed seborrheic keratosis: Secondary | ICD-10-CM | POA: Diagnosis not present

## 2020-11-20 DIAGNOSIS — E039 Hypothyroidism, unspecified: Secondary | ICD-10-CM | POA: Diagnosis not present

## 2020-11-20 DIAGNOSIS — I10 Essential (primary) hypertension: Secondary | ICD-10-CM | POA: Diagnosis not present

## 2020-11-20 DIAGNOSIS — Z008 Encounter for other general examination: Secondary | ICD-10-CM | POA: Diagnosis not present

## 2020-11-20 DIAGNOSIS — E669 Obesity, unspecified: Secondary | ICD-10-CM | POA: Diagnosis not present

## 2020-11-20 DIAGNOSIS — R69 Illness, unspecified: Secondary | ICD-10-CM | POA: Diagnosis not present

## 2020-11-20 DIAGNOSIS — I252 Old myocardial infarction: Secondary | ICD-10-CM | POA: Diagnosis not present

## 2020-12-27 DIAGNOSIS — R69 Illness, unspecified: Secondary | ICD-10-CM | POA: Diagnosis not present

## 2020-12-27 DIAGNOSIS — M8000XA Age-related osteoporosis with current pathological fracture, unspecified site, initial encounter for fracture: Secondary | ICD-10-CM | POA: Diagnosis not present

## 2020-12-27 DIAGNOSIS — M533 Sacrococcygeal disorders, not elsewhere classified: Secondary | ICD-10-CM | POA: Diagnosis not present

## 2020-12-27 DIAGNOSIS — S22080A Wedge compression fracture of T11-T12 vertebra, initial encounter for closed fracture: Secondary | ICD-10-CM | POA: Diagnosis not present

## 2020-12-27 DIAGNOSIS — J449 Chronic obstructive pulmonary disease, unspecified: Secondary | ICD-10-CM | POA: Diagnosis not present

## 2021-01-03 DIAGNOSIS — R059 Cough, unspecified: Secondary | ICD-10-CM | POA: Diagnosis not present

## 2021-01-03 DIAGNOSIS — J441 Chronic obstructive pulmonary disease with (acute) exacerbation: Secondary | ICD-10-CM | POA: Diagnosis not present

## 2021-01-03 DIAGNOSIS — J309 Allergic rhinitis, unspecified: Secondary | ICD-10-CM | POA: Diagnosis not present

## 2021-01-03 DIAGNOSIS — I129 Hypertensive chronic kidney disease with stage 1 through stage 4 chronic kidney disease, or unspecified chronic kidney disease: Secondary | ICD-10-CM | POA: Diagnosis not present

## 2021-01-03 DIAGNOSIS — Z1152 Encounter for screening for COVID-19: Secondary | ICD-10-CM | POA: Diagnosis not present

## 2021-01-03 DIAGNOSIS — R69 Illness, unspecified: Secondary | ICD-10-CM | POA: Diagnosis not present

## 2021-01-03 DIAGNOSIS — N1832 Chronic kidney disease, stage 3b: Secondary | ICD-10-CM | POA: Diagnosis not present

## 2021-01-08 ENCOUNTER — Other Ambulatory Visit: Payer: Self-pay | Admitting: Internal Medicine

## 2021-01-08 DIAGNOSIS — N644 Mastodynia: Secondary | ICD-10-CM

## 2021-01-11 ENCOUNTER — Ambulatory Visit: Payer: Medicare HMO

## 2021-01-11 ENCOUNTER — Ambulatory Visit
Admission: RE | Admit: 2021-01-11 | Discharge: 2021-01-11 | Disposition: A | Discharge status: Home / Self Care | Payer: Medicare HMO | Source: Ambulatory Visit | Attending: Internal Medicine | Admitting: Internal Medicine

## 2021-01-11 ENCOUNTER — Other Ambulatory Visit: Payer: Self-pay

## 2021-01-11 DIAGNOSIS — N644 Mastodynia: Secondary | ICD-10-CM

## 2021-01-11 DIAGNOSIS — R922 Inconclusive mammogram: Secondary | ICD-10-CM | POA: Diagnosis not present

## 2021-01-16 DIAGNOSIS — I129 Hypertensive chronic kidney disease with stage 1 through stage 4 chronic kidney disease, or unspecified chronic kidney disease: Secondary | ICD-10-CM | POA: Diagnosis not present

## 2021-01-16 DIAGNOSIS — R69 Illness, unspecified: Secondary | ICD-10-CM | POA: Diagnosis not present

## 2021-01-16 DIAGNOSIS — E039 Hypothyroidism, unspecified: Secondary | ICD-10-CM | POA: Diagnosis not present

## 2021-01-16 DIAGNOSIS — N1832 Chronic kidney disease, stage 3b: Secondary | ICD-10-CM | POA: Diagnosis not present

## 2021-01-16 DIAGNOSIS — J449 Chronic obstructive pulmonary disease, unspecified: Secondary | ICD-10-CM | POA: Diagnosis not present

## 2021-01-16 DIAGNOSIS — S22080A Wedge compression fracture of T11-T12 vertebra, initial encounter for closed fracture: Secondary | ICD-10-CM | POA: Diagnosis not present

## 2021-01-16 DIAGNOSIS — E785 Hyperlipidemia, unspecified: Secondary | ICD-10-CM | POA: Diagnosis not present

## 2021-01-16 DIAGNOSIS — R300 Dysuria: Secondary | ICD-10-CM | POA: Diagnosis not present

## 2021-01-16 DIAGNOSIS — M533 Sacrococcygeal disorders, not elsewhere classified: Secondary | ICD-10-CM | POA: Diagnosis not present

## 2021-01-16 DIAGNOSIS — M8000XA Age-related osteoporosis with current pathological fracture, unspecified site, initial encounter for fracture: Secondary | ICD-10-CM | POA: Diagnosis not present

## 2021-01-23 DIAGNOSIS — F17200 Nicotine dependence, unspecified, uncomplicated: Secondary | ICD-10-CM | POA: Diagnosis not present

## 2021-01-23 DIAGNOSIS — N1832 Chronic kidney disease, stage 3b: Secondary | ICD-10-CM | POA: Diagnosis not present

## 2021-01-23 DIAGNOSIS — S22080D Wedge compression fracture of T11-T12 vertebra, subsequent encounter for fracture with routine healing: Secondary | ICD-10-CM | POA: Diagnosis not present

## 2021-01-23 DIAGNOSIS — M810 Age-related osteoporosis without current pathological fracture: Secondary | ICD-10-CM | POA: Diagnosis not present

## 2021-01-23 DIAGNOSIS — R69 Illness, unspecified: Secondary | ICD-10-CM | POA: Diagnosis not present

## 2021-01-23 DIAGNOSIS — I129 Hypertensive chronic kidney disease with stage 1 through stage 4 chronic kidney disease, or unspecified chronic kidney disease: Secondary | ICD-10-CM | POA: Diagnosis not present

## 2021-02-13 ENCOUNTER — Encounter (HOSPITAL_COMMUNITY): Payer: Medicare HMO

## 2021-02-15 ENCOUNTER — Other Ambulatory Visit (HOSPITAL_COMMUNITY): Payer: Self-pay | Admitting: *Deleted

## 2021-02-18 ENCOUNTER — Ambulatory Visit (HOSPITAL_COMMUNITY)
Admission: RE | Admit: 2021-02-18 | Discharge: 2021-02-18 | Disposition: A | Discharge status: Home / Self Care | Payer: Medicare HMO | Source: Ambulatory Visit | Attending: Internal Medicine | Admitting: Internal Medicine

## 2021-02-18 MED ORDER — DENOSUMAB 60 MG/ML ~~LOC~~ SOSY
PREFILLED_SYRINGE | SUBCUTANEOUS | Status: AC
  Filled 2021-02-18: qty 1

## 2021-03-07 DIAGNOSIS — M1990 Unspecified osteoarthritis, unspecified site: Secondary | ICD-10-CM | POA: Diagnosis not present

## 2021-03-07 DIAGNOSIS — S22080D Wedge compression fracture of T11-T12 vertebra, subsequent encounter for fracture with routine healing: Secondary | ICD-10-CM | POA: Diagnosis not present

## 2021-03-07 DIAGNOSIS — M810 Age-related osteoporosis without current pathological fracture: Secondary | ICD-10-CM | POA: Diagnosis not present

## 2021-03-07 DIAGNOSIS — S2239XA Fracture of one rib, unspecified side, initial encounter for closed fracture: Secondary | ICD-10-CM | POA: Diagnosis not present

## 2021-03-07 DIAGNOSIS — M8000XA Age-related osteoporosis with current pathological fracture, unspecified site, initial encounter for fracture: Secondary | ICD-10-CM | POA: Diagnosis not present

## 2021-03-07 DIAGNOSIS — M533 Sacrococcygeal disorders, not elsewhere classified: Secondary | ICD-10-CM | POA: Diagnosis not present

## 2021-03-07 DIAGNOSIS — S22080A Wedge compression fracture of T11-T12 vertebra, initial encounter for closed fracture: Secondary | ICD-10-CM | POA: Diagnosis not present

## 2021-05-01 DIAGNOSIS — C44722 Squamous cell carcinoma of skin of right lower limb, including hip: Secondary | ICD-10-CM | POA: Diagnosis not present

## 2021-05-01 DIAGNOSIS — L820 Inflamed seborrheic keratosis: Secondary | ICD-10-CM | POA: Diagnosis not present

## 2021-05-01 DIAGNOSIS — D485 Neoplasm of uncertain behavior of skin: Secondary | ICD-10-CM | POA: Diagnosis not present

## 2021-05-01 DIAGNOSIS — L570 Actinic keratosis: Secondary | ICD-10-CM | POA: Diagnosis not present

## 2021-05-01 DIAGNOSIS — C44629 Squamous cell carcinoma of skin of left upper limb, including shoulder: Secondary | ICD-10-CM | POA: Diagnosis not present

## 2021-05-16 DIAGNOSIS — N1832 Chronic kidney disease, stage 3b: Secondary | ICD-10-CM | POA: Diagnosis not present

## 2021-05-16 DIAGNOSIS — F17200 Nicotine dependence, unspecified, uncomplicated: Secondary | ICD-10-CM | POA: Diagnosis not present

## 2021-05-16 DIAGNOSIS — I129 Hypertensive chronic kidney disease with stage 1 through stage 4 chronic kidney disease, or unspecified chronic kidney disease: Secondary | ICD-10-CM | POA: Diagnosis not present

## 2021-05-16 DIAGNOSIS — M810 Age-related osteoporosis without current pathological fracture: Secondary | ICD-10-CM | POA: Diagnosis not present

## 2021-05-16 DIAGNOSIS — R69 Illness, unspecified: Secondary | ICD-10-CM | POA: Diagnosis not present

## 2021-05-20 DIAGNOSIS — R300 Dysuria: Secondary | ICD-10-CM | POA: Diagnosis not present

## 2021-05-28 DIAGNOSIS — C44722 Squamous cell carcinoma of skin of right lower limb, including hip: Secondary | ICD-10-CM | POA: Diagnosis not present

## 2021-06-01 DIAGNOSIS — Z23 Encounter for immunization: Secondary | ICD-10-CM | POA: Diagnosis not present

## 2021-06-04 ENCOUNTER — Ambulatory Visit (HOSPITAL_COMMUNITY): Payer: Medicare HMO

## 2021-07-02 DIAGNOSIS — L570 Actinic keratosis: Secondary | ICD-10-CM | POA: Diagnosis not present

## 2021-07-02 DIAGNOSIS — L821 Other seborrheic keratosis: Secondary | ICD-10-CM | POA: Diagnosis not present

## 2021-07-02 DIAGNOSIS — L910 Hypertrophic scar: Secondary | ICD-10-CM | POA: Diagnosis not present

## 2021-07-02 DIAGNOSIS — Z85828 Personal history of other malignant neoplasm of skin: Secondary | ICD-10-CM | POA: Diagnosis not present

## 2021-09-02 DIAGNOSIS — H26493 Other secondary cataract, bilateral: Secondary | ICD-10-CM | POA: Diagnosis not present

## 2021-09-02 DIAGNOSIS — H353131 Nonexudative age-related macular degeneration, bilateral, early dry stage: Secondary | ICD-10-CM | POA: Diagnosis not present

## 2021-09-24 DIAGNOSIS — H65199 Other acute nonsuppurative otitis media, unspecified ear: Secondary | ICD-10-CM | POA: Diagnosis not present

## 2021-09-24 DIAGNOSIS — R42 Dizziness and giddiness: Secondary | ICD-10-CM | POA: Diagnosis not present

## 2021-11-12 DIAGNOSIS — E039 Hypothyroidism, unspecified: Secondary | ICD-10-CM | POA: Diagnosis not present

## 2021-11-12 DIAGNOSIS — E785 Hyperlipidemia, unspecified: Secondary | ICD-10-CM | POA: Diagnosis not present

## 2021-11-12 DIAGNOSIS — E559 Vitamin D deficiency, unspecified: Secondary | ICD-10-CM | POA: Diagnosis not present

## 2021-11-19 DIAGNOSIS — Z23 Encounter for immunization: Secondary | ICD-10-CM | POA: Diagnosis not present

## 2021-11-19 DIAGNOSIS — J449 Chronic obstructive pulmonary disease, unspecified: Secondary | ICD-10-CM | POA: Diagnosis not present

## 2021-11-19 DIAGNOSIS — N1832 Chronic kidney disease, stage 3b: Secondary | ICD-10-CM | POA: Diagnosis not present

## 2021-11-19 DIAGNOSIS — E039 Hypothyroidism, unspecified: Secondary | ICD-10-CM | POA: Diagnosis not present

## 2021-11-19 DIAGNOSIS — R69 Illness, unspecified: Secondary | ICD-10-CM | POA: Diagnosis not present

## 2021-11-19 DIAGNOSIS — R82998 Other abnormal findings in urine: Secondary | ICD-10-CM | POA: Diagnosis not present

## 2021-11-19 DIAGNOSIS — Z0000 Encounter for general adult medical examination without abnormal findings: Secondary | ICD-10-CM | POA: Diagnosis not present

## 2021-11-19 DIAGNOSIS — R300 Dysuria: Secondary | ICD-10-CM | POA: Diagnosis not present

## 2021-11-19 DIAGNOSIS — I129 Hypertensive chronic kidney disease with stage 1 through stage 4 chronic kidney disease, or unspecified chronic kidney disease: Secondary | ICD-10-CM | POA: Diagnosis not present

## 2021-11-19 DIAGNOSIS — E785 Hyperlipidemia, unspecified: Secondary | ICD-10-CM | POA: Diagnosis not present

## 2021-11-19 DIAGNOSIS — M533 Sacrococcygeal disorders, not elsewhere classified: Secondary | ICD-10-CM | POA: Diagnosis not present

## 2021-12-09 ENCOUNTER — Other Ambulatory Visit (HOSPITAL_COMMUNITY): Payer: Self-pay | Admitting: *Deleted

## 2021-12-10 ENCOUNTER — Ambulatory Visit (HOSPITAL_COMMUNITY)
Admission: RE | Admit: 2021-12-10 | Discharge: 2021-12-10 | Disposition: A | Discharge status: Home / Self Care | Payer: Medicare HMO | Source: Ambulatory Visit | Attending: Internal Medicine | Admitting: Internal Medicine

## 2021-12-10 DIAGNOSIS — M810 Age-related osteoporosis without current pathological fracture: Secondary | ICD-10-CM | POA: Diagnosis not present

## 2021-12-10 MED ORDER — ZOLEDRONIC ACID 5 MG/100ML IV SOLN
INTRAVENOUS | Status: AC
  Filled 2021-12-10: qty 100

## 2021-12-10 MED ORDER — ZOLEDRONIC ACID 5 MG/100ML IV SOLN
5.0000 mg | Freq: Once | INTRAVENOUS | Status: AC
  Administered 2021-12-10: 5 mg via INTRAVENOUS

## 2021-12-18 DIAGNOSIS — N1832 Chronic kidney disease, stage 3b: Secondary | ICD-10-CM | POA: Diagnosis not present

## 2021-12-18 DIAGNOSIS — M810 Age-related osteoporosis without current pathological fracture: Secondary | ICD-10-CM | POA: Diagnosis not present

## 2021-12-18 DIAGNOSIS — E785 Hyperlipidemia, unspecified: Secondary | ICD-10-CM | POA: Diagnosis not present

## 2021-12-18 DIAGNOSIS — J449 Chronic obstructive pulmonary disease, unspecified: Secondary | ICD-10-CM | POA: Diagnosis not present

## 2021-12-18 DIAGNOSIS — R69 Illness, unspecified: Secondary | ICD-10-CM | POA: Diagnosis not present

## 2021-12-18 DIAGNOSIS — H6062 Unspecified chronic otitis externa, left ear: Secondary | ICD-10-CM | POA: Diagnosis not present

## 2021-12-25 DIAGNOSIS — Z7689 Persons encountering health services in other specified circumstances: Secondary | ICD-10-CM | POA: Diagnosis not present

## 2021-12-25 DIAGNOSIS — J449 Chronic obstructive pulmonary disease, unspecified: Secondary | ICD-10-CM | POA: Diagnosis not present

## 2021-12-25 DIAGNOSIS — R059 Cough, unspecified: Secondary | ICD-10-CM | POA: Diagnosis not present

## 2021-12-25 DIAGNOSIS — J069 Acute upper respiratory infection, unspecified: Secondary | ICD-10-CM | POA: Diagnosis not present

## 2021-12-25 DIAGNOSIS — Z1152 Encounter for screening for COVID-19: Secondary | ICD-10-CM | POA: Diagnosis not present

## 2021-12-25 DIAGNOSIS — J3089 Other allergic rhinitis: Secondary | ICD-10-CM | POA: Diagnosis not present

## 2021-12-25 DIAGNOSIS — N1832 Chronic kidney disease, stage 3b: Secondary | ICD-10-CM | POA: Diagnosis not present

## 2021-12-25 DIAGNOSIS — R0981 Nasal congestion: Secondary | ICD-10-CM | POA: Diagnosis not present

## 2021-12-27 ENCOUNTER — Other Ambulatory Visit: Payer: Self-pay | Admitting: Internal Medicine

## 2021-12-27 DIAGNOSIS — N1832 Chronic kidney disease, stage 3b: Secondary | ICD-10-CM

## 2022-01-10 ENCOUNTER — Ambulatory Visit
Admission: RE | Admit: 2022-01-10 | Discharge: 2022-01-10 | Disposition: A | Discharge status: Home / Self Care | Payer: Medicare HMO | Source: Ambulatory Visit | Attending: Internal Medicine | Admitting: Internal Medicine

## 2022-01-10 DIAGNOSIS — N1832 Chronic kidney disease, stage 3b: Secondary | ICD-10-CM

## 2022-03-13 DIAGNOSIS — S22080D Wedge compression fracture of T11-T12 vertebra, subsequent encounter for fracture with routine healing: Secondary | ICD-10-CM | POA: Diagnosis not present

## 2022-03-13 DIAGNOSIS — N1832 Chronic kidney disease, stage 3b: Secondary | ICD-10-CM | POA: Diagnosis not present

## 2022-03-13 DIAGNOSIS — J449 Chronic obstructive pulmonary disease, unspecified: Secondary | ICD-10-CM | POA: Diagnosis not present

## 2022-03-13 DIAGNOSIS — M810 Age-related osteoporosis without current pathological fracture: Secondary | ICD-10-CM | POA: Diagnosis not present

## 2022-03-13 DIAGNOSIS — I129 Hypertensive chronic kidney disease with stage 1 through stage 4 chronic kidney disease, or unspecified chronic kidney disease: Secondary | ICD-10-CM | POA: Diagnosis not present

## 2022-05-13 ENCOUNTER — Other Ambulatory Visit (HOSPITAL_COMMUNITY): Payer: Self-pay | Admitting: Internal Medicine

## 2022-05-13 ENCOUNTER — Ambulatory Visit (HOSPITAL_COMMUNITY)
Admission: RE | Admit: 2022-05-13 | Discharge: 2022-05-13 | Disposition: A | Discharge status: Home / Self Care | Payer: Medicare HMO | Source: Ambulatory Visit | Attending: Vascular Surgery | Admitting: Vascular Surgery

## 2022-05-13 DIAGNOSIS — I872 Venous insufficiency (chronic) (peripheral): Secondary | ICD-10-CM | POA: Diagnosis not present

## 2022-05-13 DIAGNOSIS — I82432 Acute embolism and thrombosis of left popliteal vein: Secondary | ICD-10-CM | POA: Diagnosis not present

## 2022-05-13 DIAGNOSIS — I129 Hypertensive chronic kidney disease with stage 1 through stage 4 chronic kidney disease, or unspecified chronic kidney disease: Secondary | ICD-10-CM | POA: Diagnosis not present

## 2022-05-13 DIAGNOSIS — J449 Chronic obstructive pulmonary disease, unspecified: Secondary | ICD-10-CM | POA: Diagnosis not present

## 2022-05-13 DIAGNOSIS — R600 Localized edema: Secondary | ICD-10-CM | POA: Diagnosis not present

## 2022-05-13 DIAGNOSIS — Z720 Tobacco use: Secondary | ICD-10-CM | POA: Diagnosis not present

## 2022-05-13 DIAGNOSIS — N1832 Chronic kidney disease, stage 3b: Secondary | ICD-10-CM | POA: Diagnosis not present

## 2022-05-22 DIAGNOSIS — I129 Hypertensive chronic kidney disease with stage 1 through stage 4 chronic kidney disease, or unspecified chronic kidney disease: Secondary | ICD-10-CM | POA: Diagnosis not present

## 2022-05-22 DIAGNOSIS — J449 Chronic obstructive pulmonary disease, unspecified: Secondary | ICD-10-CM | POA: Diagnosis not present

## 2022-05-22 DIAGNOSIS — N1832 Chronic kidney disease, stage 3b: Secondary | ICD-10-CM | POA: Diagnosis not present

## 2022-05-22 DIAGNOSIS — Z23 Encounter for immunization: Secondary | ICD-10-CM | POA: Diagnosis not present

## 2022-05-22 DIAGNOSIS — Z720 Tobacco use: Secondary | ICD-10-CM | POA: Diagnosis not present

## 2022-05-22 DIAGNOSIS — I872 Venous insufficiency (chronic) (peripheral): Secondary | ICD-10-CM | POA: Diagnosis not present

## 2022-05-22 DIAGNOSIS — I82432 Acute embolism and thrombosis of left popliteal vein: Secondary | ICD-10-CM | POA: Diagnosis not present

## 2022-05-22 DIAGNOSIS — R634 Abnormal weight loss: Secondary | ICD-10-CM | POA: Diagnosis not present

## 2022-05-23 ENCOUNTER — Other Ambulatory Visit: Payer: Self-pay | Admitting: Internal Medicine

## 2022-05-23 DIAGNOSIS — R634 Abnormal weight loss: Secondary | ICD-10-CM

## 2022-05-23 DIAGNOSIS — R059 Cough, unspecified: Secondary | ICD-10-CM

## 2022-05-26 IMAGING — US US RENAL
1 series · 14 of 25 positions shown · non-contrast
Comparison: CT abdomen 11/16/2018

CLINICAL DATA: Chronic kidney disease stage 3 B

EXAM:
RENAL / URINARY TRACT ULTRASOUND COMPLETE

[Series 1: us renal · 0.23mm/px · 47 acquisitions, 14 frames shown]
[im 1/47]
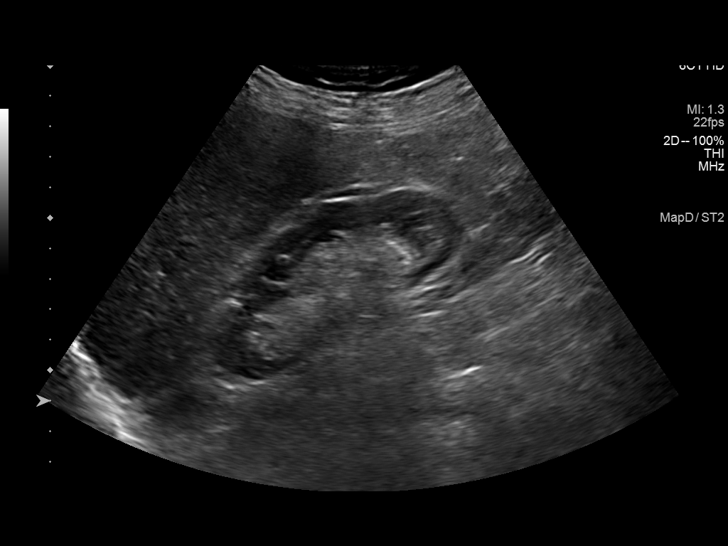
[im 4/47]
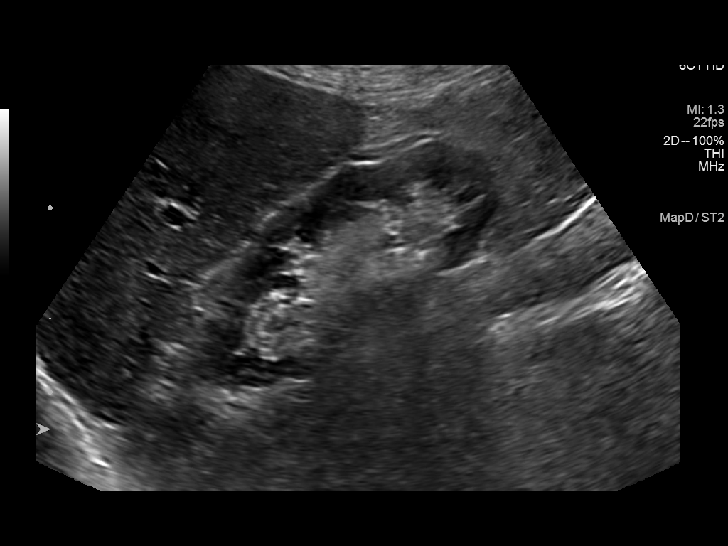
[im 8/47]
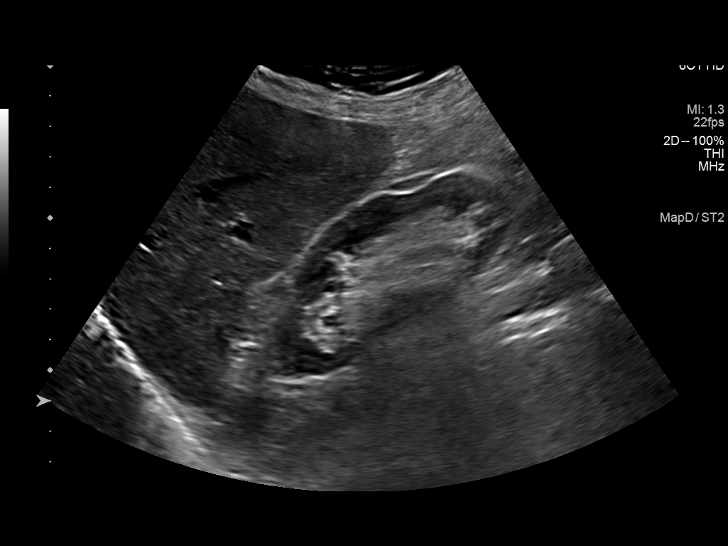
[im 12/47]
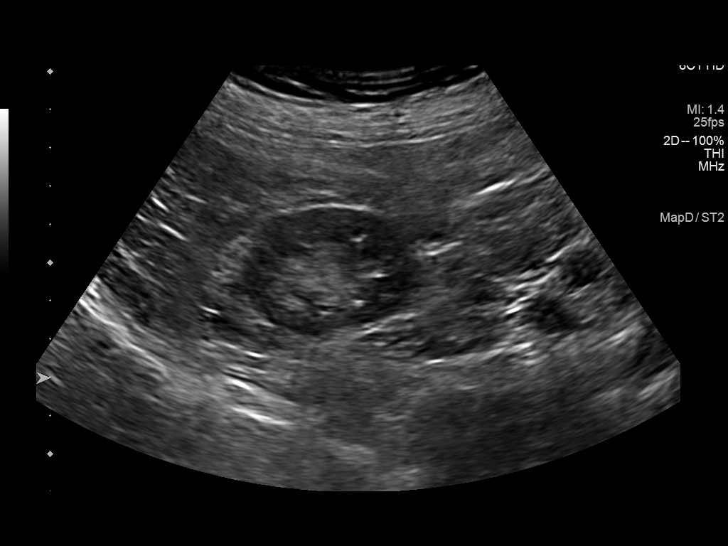
[im 16/47]
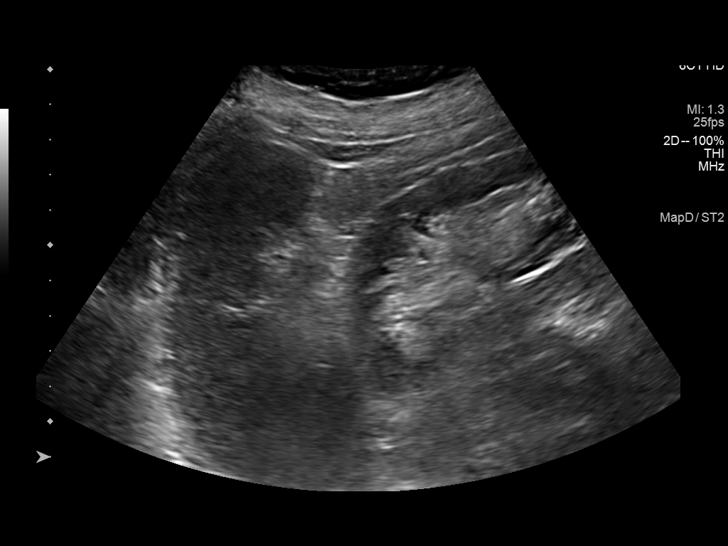
[im 18/47]
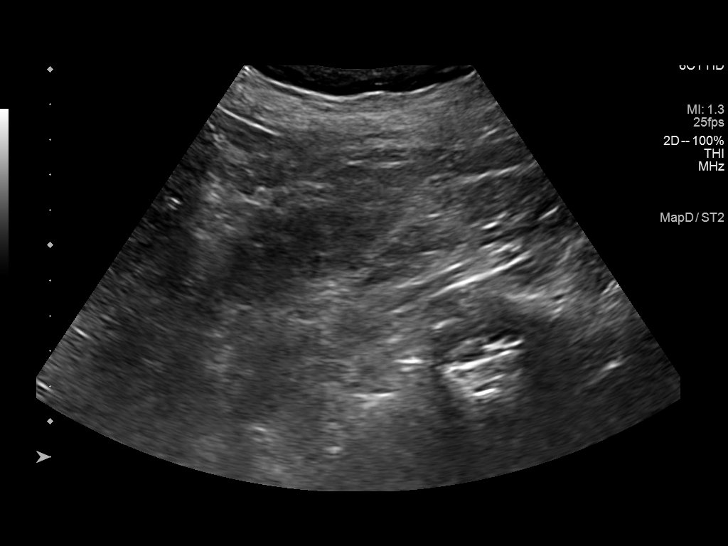
[im 22/47]
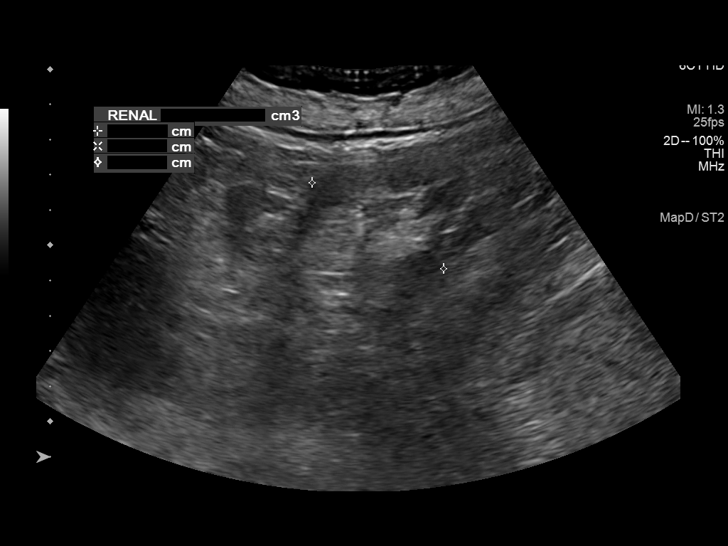
[im 25/47]
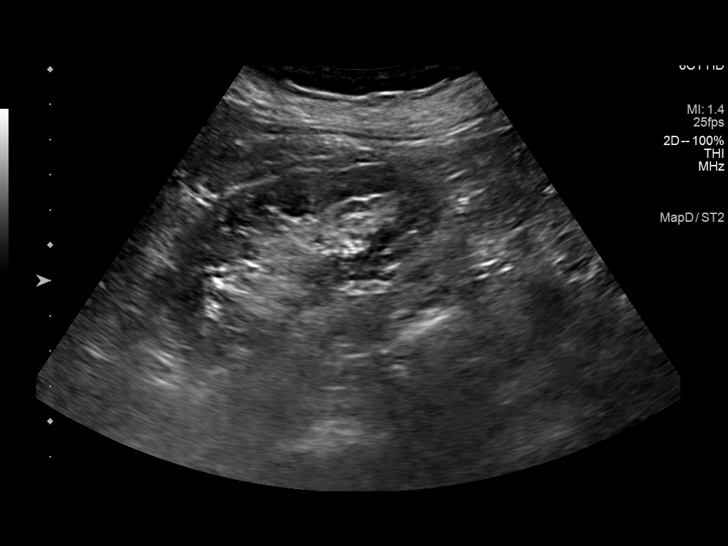
[im 29/47]
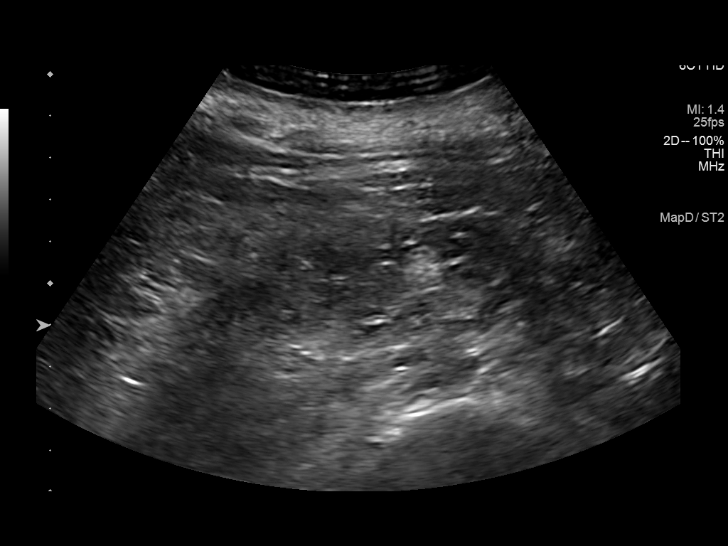
[im 31/47]
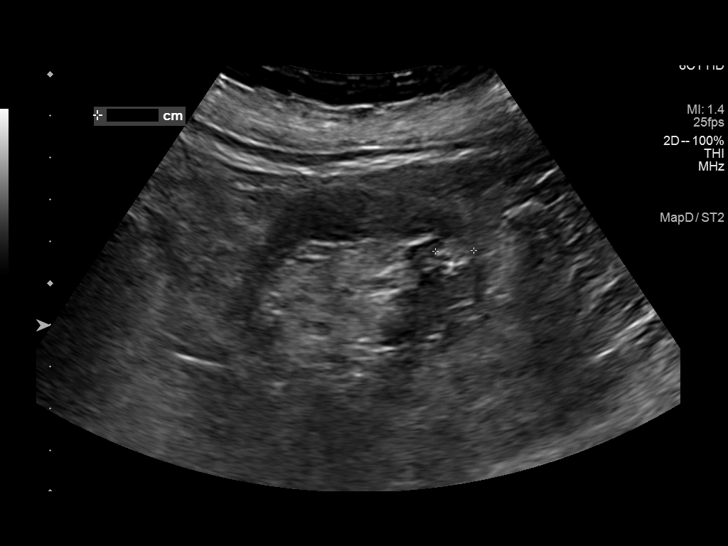
[im 35/47]
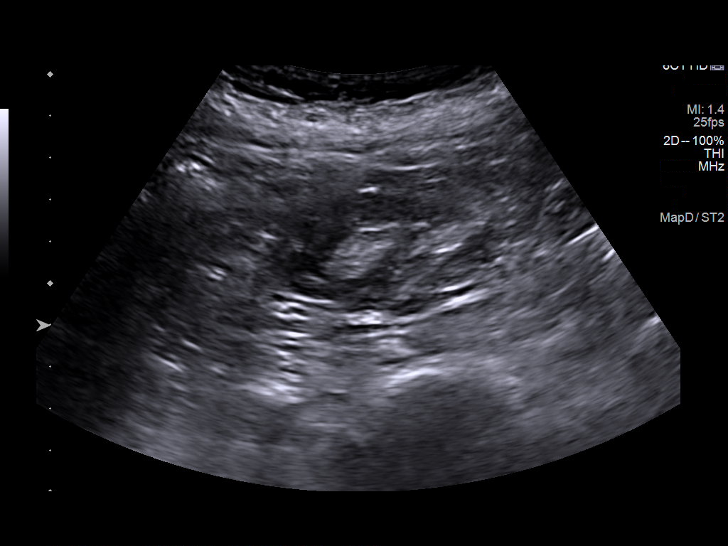
[im 39/47]
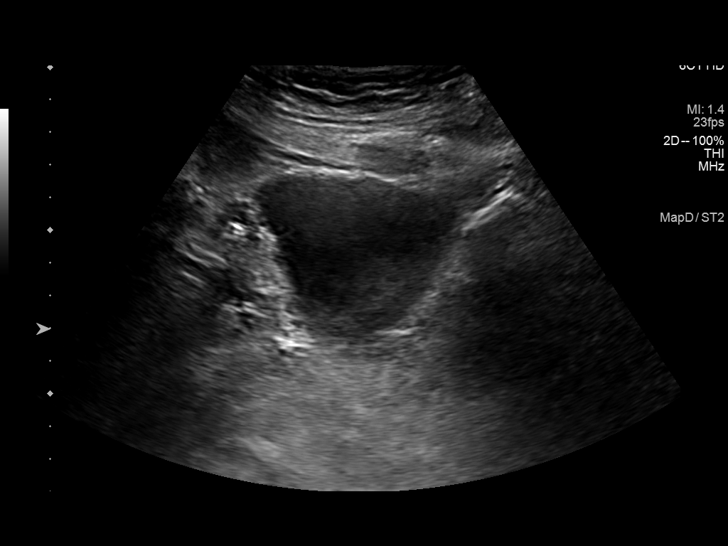
[im 43/47]
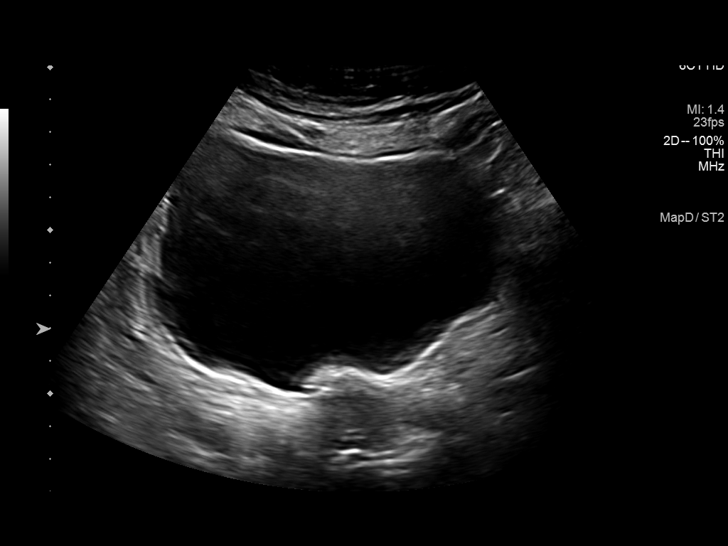
[im 47/47]
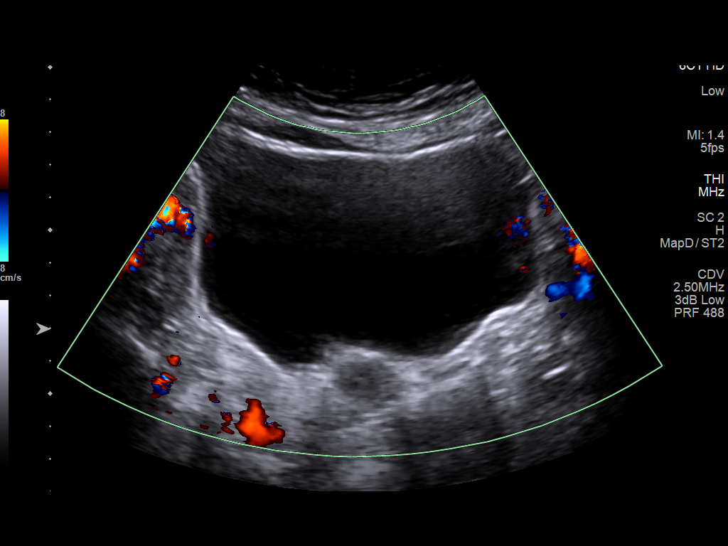

[14 of 25 positions shown; findings below may reference images not displayed]

FINDINGS: Right Kidney:

Renal measurements: 9.6 x 3.8 x 5 cm = volume: 95 mL. Echogenicity
within normal limits. No mass or hydronephrosis visualized.

Left Kidney:

Renal measurements: 9.2 x 4.2 x 4.5 cm = volume: 91.1 mL.
Echogenicity within normal limits. No hydronephrosis visualized. 9 x
10 x 9 mm hypoechoic area adjacent to the renal sinus fat which may
reflect prominent renal sinus fat extending more peripherally versus
a small hyperdense cyst versus angiomyolipoma. When correlated with
the prior CT of 01/11/2021, this likely reflects prominent renal
sinus fat with more peripheral extension.

Bladder:

Appears normal for degree of bladder distention.

Other:

None.
IMPRESSION: Normal renal ultrasound.

## 2022-06-02 DIAGNOSIS — H26493 Other secondary cataract, bilateral: Secondary | ICD-10-CM | POA: Diagnosis not present

## 2022-06-02 DIAGNOSIS — H353131 Nonexudative age-related macular degeneration, bilateral, early dry stage: Secondary | ICD-10-CM | POA: Diagnosis not present

## 2022-06-09 DIAGNOSIS — I129 Hypertensive chronic kidney disease with stage 1 through stage 4 chronic kidney disease, or unspecified chronic kidney disease: Secondary | ICD-10-CM | POA: Diagnosis not present

## 2022-06-09 DIAGNOSIS — N1832 Chronic kidney disease, stage 3b: Secondary | ICD-10-CM | POA: Diagnosis not present

## 2022-06-09 DIAGNOSIS — I82432 Acute embolism and thrombosis of left popliteal vein: Secondary | ICD-10-CM | POA: Diagnosis not present

## 2022-06-09 DIAGNOSIS — I872 Venous insufficiency (chronic) (peripheral): Secondary | ICD-10-CM | POA: Diagnosis not present

## 2022-06-09 DIAGNOSIS — R634 Abnormal weight loss: Secondary | ICD-10-CM | POA: Diagnosis not present

## 2022-06-19 ENCOUNTER — Ambulatory Visit
Admission: RE | Admit: 2022-06-19 | Discharge: 2022-06-19 | Disposition: A | Discharge status: Home / Self Care | Payer: Medicare HMO | Source: Ambulatory Visit | Attending: Internal Medicine | Admitting: Internal Medicine

## 2022-06-19 DIAGNOSIS — R634 Abnormal weight loss: Secondary | ICD-10-CM | POA: Diagnosis not present

## 2022-06-19 DIAGNOSIS — J439 Emphysema, unspecified: Secondary | ICD-10-CM | POA: Diagnosis not present

## 2022-06-19 DIAGNOSIS — R059 Cough, unspecified: Secondary | ICD-10-CM

## 2022-06-19 DIAGNOSIS — I700 Atherosclerosis of aorta: Secondary | ICD-10-CM | POA: Diagnosis not present

## 2022-06-19 DIAGNOSIS — R918 Other nonspecific abnormal finding of lung field: Secondary | ICD-10-CM | POA: Diagnosis not present

## 2022-06-19 MED ORDER — IOPAMIDOL (ISOVUE-300) INJECTION 61%
60.0000 mL | Freq: Once | INTRAVENOUS | Status: AC | PRN
  Administered 2022-06-19: 60 mL via INTRAVENOUS

## 2022-07-14 DIAGNOSIS — R0981 Nasal congestion: Secondary | ICD-10-CM | POA: Diagnosis not present

## 2022-07-14 DIAGNOSIS — J209 Acute bronchitis, unspecified: Secondary | ICD-10-CM | POA: Diagnosis not present

## 2022-07-14 DIAGNOSIS — J3089 Other allergic rhinitis: Secondary | ICD-10-CM | POA: Diagnosis not present

## 2022-07-14 DIAGNOSIS — Z1152 Encounter for screening for COVID-19: Secondary | ICD-10-CM | POA: Diagnosis not present

## 2022-07-14 DIAGNOSIS — R69 Illness, unspecified: Secondary | ICD-10-CM | POA: Diagnosis not present

## 2022-07-14 DIAGNOSIS — J449 Chronic obstructive pulmonary disease, unspecified: Secondary | ICD-10-CM | POA: Diagnosis not present

## 2022-07-14 DIAGNOSIS — J029 Acute pharyngitis, unspecified: Secondary | ICD-10-CM | POA: Diagnosis not present

## 2022-08-04 DIAGNOSIS — H353131 Nonexudative age-related macular degeneration, bilateral, early dry stage: Secondary | ICD-10-CM | POA: Diagnosis not present

## 2022-08-06 DIAGNOSIS — Z0100 Encounter for examination of eyes and vision without abnormal findings: Secondary | ICD-10-CM | POA: Diagnosis not present

## 2022-08-20 DIAGNOSIS — I82432 Acute embolism and thrombosis of left popliteal vein: Secondary | ICD-10-CM | POA: Diagnosis not present

## 2022-08-20 DIAGNOSIS — R200 Anesthesia of skin: Secondary | ICD-10-CM | POA: Diagnosis not present

## 2022-09-02 ENCOUNTER — Ambulatory Visit: Payer: Medicare HMO | Admitting: Podiatry

## 2022-09-09 ENCOUNTER — Ambulatory Visit: Payer: Medicare HMO | Admitting: Podiatry

## 2022-09-16 ENCOUNTER — Ambulatory Visit: Payer: Medicare HMO | Admitting: Podiatry

## 2022-09-16 DIAGNOSIS — M7742 Metatarsalgia, left foot: Secondary | ICD-10-CM | POA: Diagnosis not present

## 2022-09-16 NOTE — Progress Notes (Signed)
  Subjective:  Patient ID: Jenna Ryan, female    DOB: November 15, 1940,  MRN: [id number]  Chief Complaint  Patient presents with   Toe Pain    Right great toe pain pt stated that it feels numb     82 y.o. female presents with the above complaint.  Patient presents with complaint left forefoot metatarsalgia pain on palpation and when ambulating.  Patient wears flats or shoes or goes barefooted.  She wanted discuss treatment options for this she has not seen anyone as prior to seeing me for this hurts with ambulation worse with pressure pain scale 7 out of 10 dull achy in nature.   Review of Systems: Negative except as noted in the HPI. Denies N/V/F/Ch.  Past Medical History:  Diagnosis Date   Cancer ([place])    skin    Current Outpatient Medications:    HYDROcodone-acetaminophen (NORCO/VICODIN) 5-325 MG per tablet, Take 2 tablets by mouth every 4 (four) hours as needed for pain., Disp: 20 tablet, Rfl: 0   ibuprofen (ADVIL,MOTRIN) 200 MG tablet, Take 400 mg by mouth every 6 (six) hours as needed for pain., Disp: , Rfl:    levothyroxine (SYNTHROID, LEVOTHROID) 88 MCG tablet, Take 88 mcg by mouth daily., Disp: , Rfl:    rosuvastatin (CRESTOR) 5 MG tablet, Take 5 mg by mouth daily., Disp: , Rfl:    Vitamin D, Ergocalciferol, (DRISDOL) 50000 UNITS CAPS, Take 50,000 Units by mouth., Disp: , Rfl:   Social History   Tobacco Use  Smoking Status Every Day  Smokeless Tobacco Not on file    Allergies  Allergen Reactions   Macrolides And Ketolides Itching and Rash   Penicillins Itching and Rash   Sulfa Antibiotics Itching and Rash   Objective:  There were no vitals filed for this visit. There is no height or weight on file to calculate BMI. Constitutional Well developed. Well nourished.  Vascular Dorsalis pedis pulses palpable bilaterally. Posterior tibial pulses palpable bilaterally. Capillary refill normal to all digits.  No cyanosis or clubbing noted. Pedal hair growth normal.   Neurologic Normal speech. Oriented to person, place, and time. Epicritic sensation to light touch grossly present bilaterally.  Dermatologic Left forefoot metatarsalgia noted with plantar fat pad atrophy.  No localized pain noted generalized pain noted across the forefoot.  Orthopedic: Normal joint ROM without pain or crepitus bilaterally. No visible deformities. No bony tenderness.   Radiographs: None Assessment:   1. Metatarsalgia, left foot    Plan:  Patient was evaluated and treated and all questions answered.  Left forefoot metatarsalgia with underlying plantar fat pad atrophy -All questions and concerns were discussed with the patient in extensive detail -Given the amount of plantar fat pad atrophy that is present I believe patient would benefit from metatarsal pads which were dispensed.  If there is an improvement we will discuss orthotics option. Co. visit.  She states understanding.  I discussed shoe gear modification as well in extensive detail  No follow-ups on file.

## 2022-10-01 ENCOUNTER — Ambulatory Visit: Payer: Medicare HMO | Admitting: Podiatry

## 2022-10-02 ENCOUNTER — Ambulatory Visit: Payer: Medicare HMO | Admitting: Podiatry

## 2022-10-02 DIAGNOSIS — Z01818 Encounter for other preprocedural examination: Secondary | ICD-10-CM | POA: Diagnosis not present

## 2022-10-02 DIAGNOSIS — L603 Nail dystrophy: Secondary | ICD-10-CM | POA: Diagnosis not present

## 2022-10-02 DIAGNOSIS — I999 Unspecified disorder of circulatory system: Secondary | ICD-10-CM

## 2022-10-02 NOTE — Progress Notes (Signed)
  Subjective:  Patient ID: Jenna Ryan, female    DOB: February 15, 1941,  MRN: [id number]  Chief Complaint  Patient presents with   Foot Pain    Pt stated that her left foot is still bothering her     82 y.o. female presents with the above complaint.  Patient presents with left second and hallux dystrophy with thickened elongated dystrophic nail.  She states is causing her a lot of pain.  She would like to have it removed.  She wanted to discuss treatment options for this.  She does not have any vascular history that she is aware of.   Review of Systems: Negative except as noted in the HPI. Denies N/V/F/Ch.  Past Medical History:  Diagnosis Date   Cancer ([place])    skin    Current Outpatient Medications:    HYDROcodone-acetaminophen (NORCO/VICODIN) 5-325 MG per tablet, Take 2 tablets by mouth every 4 (four) hours as needed for pain., Disp: 20 tablet, Rfl: 0   ibuprofen (ADVIL,MOTRIN) 200 MG tablet, Take 400 mg by mouth every 6 (six) hours as needed for pain., Disp: , Rfl:    levothyroxine (SYNTHROID, LEVOTHROID) 88 MCG tablet, Take 88 mcg by mouth daily., Disp: , Rfl:    rosuvastatin (CRESTOR) 5 MG tablet, Take 5 mg by mouth daily., Disp: , Rfl:    Vitamin D, Ergocalciferol, (DRISDOL) 50000 UNITS CAPS, Take 50,000 Units by mouth., Disp: , Rfl:   Social History   Tobacco Use  Smoking Status Every Day  Smokeless Tobacco Not on file    Allergies  Allergen Reactions   Macrolides And Ketolides Itching and Rash   Penicillins Itching and Rash   Sulfa Antibiotics Itching and Rash   Objective:  There were no vitals filed for this visit. There is no height or weight on file to calculate BMI. Constitutional Well developed. Well nourished.  Vascular Dorsalis pedis pulses nonpalpable bilaterally. Posterior tibial pulses nonpalpable both bilaterally. Capillary refill normal to all digits.  No cyanosis or clubbing noted. Pedal hair growth normal.  Neurologic Normal speech. Oriented to  person, place, and time. Epicritic sensation to light touch grossly present bilaterally.  Dermatologic Nails thickened elongated dystrophic mycotic nails x 2 left hallux and second digit.  Pain on palpation to the toe nail Skin within normal limits  Orthopedic: Normal joint ROM without pain or crepitus bilaterally. No visible deformities. No bony tenderness.   Radiographs: None Assessment:   1. Nail dystrophy   2. Vascular abnormality   3. Encounter for preoperative examination for general surgical procedure    Plan:  Patient was evaluated and treated and all questions answered.  Left hallux and second digit nail dystrophy -All questions and concerns were discussed with the patient in extensive detail -Unfortunately she is very terrified of the needle and has phobia to it and therefore she would like to do it in the surgery center.  I discussed my preoperative intra postop plan with the patient in extensive detail.  I believe she will benefit from left hallux and second digit total nail avulsion with phenol matricectomy.  She agrees with the plan like to proceed with surgery -However prior to undergoing surgery patient will benefit from ABIs PVRs to assess the blood flow.  Patient will need vascular optimization if there is abnormal ABIs.  I discussed with patient she states understanding No follow-ups on file.  Left hallux and second nail dystrophy total nail avulsion in surgery center   [facility] before surgery

## 2022-10-03 ENCOUNTER — Telehealth: Payer: Self-pay | Admitting: Urology

## 2022-10-03 NOTE — Telephone Encounter (Signed)
DOS - 10/27/22  EXC NAIL PERM 1ST AND 2ND LEFT --- [id number]  [employer]   PER [employer] CPT CODE 09811 NO PRIOR AUTH IS REQUIRED.   REF # [id number]

## 2022-10-08 ENCOUNTER — Ambulatory Visit (HOSPITAL_COMMUNITY)
Admission: RE | Admit: 2022-10-08 | Discharge: 2022-10-08 | Disposition: A | Discharge status: Home / Self Care | Payer: Medicare HMO | Source: Ambulatory Visit | Attending: Podiatry | Admitting: Podiatry

## 2022-10-08 DIAGNOSIS — L603 Nail dystrophy: Secondary | ICD-10-CM | POA: Insufficient documentation

## 2022-10-08 LAB — VAS US ABI WITH/WO TBI
Left ABI: 0.31
Right ABI: 0.51

## 2022-10-09 ENCOUNTER — Other Ambulatory Visit: Payer: Self-pay | Admitting: Podiatry

## 2022-10-09 ENCOUNTER — Telehealth: Payer: Self-pay

## 2022-10-09 DIAGNOSIS — I739 Peripheral vascular disease, unspecified: Secondary | ICD-10-CM

## 2022-10-09 NOTE — Telephone Encounter (Signed)
Pt called requesting results from yesterday's [place] to be sent to her doctor so she can have surgery.  Reviewed pt's chart, returned call for clarification, no answer, lf vm.

## 2022-10-14 ENCOUNTER — Telehealth: Payer: Self-pay | Admitting: Podiatry

## 2022-10-14 NOTE — Telephone Encounter (Signed)
Patient called and stated that she had some test done on 02.15.2024 for her foot. She stated that someone called her from [place] ( she doesn't remember if it was our office) stating that she needs another test done before Dr. Posey Pronto can perform her sx.   Can someone follow up on this and give the patient a call.

## 2022-10-31 ENCOUNTER — Ambulatory Visit
Admission: RE | Admit: 2022-10-31 | Discharge: 2022-10-31 | Disposition: A | Discharge status: Home / Self Care | Payer: Medicare HMO | Source: Ambulatory Visit | Attending: Podiatry | Admitting: Podiatry

## 2022-10-31 DIAGNOSIS — I739 Peripheral vascular disease, unspecified: Secondary | ICD-10-CM | POA: Diagnosis not present

## 2022-10-31 HISTORY — PX: IR RADIOLOGIST EVAL & MGMT: IMG5224

## 2022-10-31 NOTE — Consult Note (Signed)
Chief Complaint: Left great toe pain  Referring Physician(s): Patel,Kevin P  PCP: Dr. Dagmar Hait, [facility]  History of Present Illness: Jenna Ryan is a 82 y.o. female presenting today to [facility], kindly referred by Dr. Posey Pronto, for evaluation of PAD, left foot/great toe pain, and candidacy for possible revascularization.   Jenna Ryan joins [place] today by herself.   She tells me that over the past several weeks to months she has been having increasing pain in her left toe and forefoot, with associated pain that extends along the plantar aspect of the left foot towards the heel.  She also describes some "numbness" of the forefoot involving the first through fourth toes.   The pain is there typically daily and has been worsening.    She tells me that she does not do much ambulating around the house or neighborhood.  She has a neighbor that gets her mail for her.  She does not seem to be ambulating far enough to create claudication.    She denies history of MI or stroke.  She does not have a history of CHF.  She was treated more than 10 years ago for a superficial skin cancer of the left lower extremity.    She is a widow, as her husband passed about 3 years ago.  She had 1 son who passed a few years ago at the age of 58 (she states adopted).  She lives by herself.  She has 1 brother and 1 sister.  Her sister, Jenna Ryan, is her POA.  She also has a niece from her husband's family who is a Marine scientist and is involved with her health care decision making.   Cardiovascular risk factors include: Smoking (1ppd since 82yo), HTN,   Non-invasive exam:  Right ABI: 0.51 Right TBI:0.22 Left ABI: 0.31 Left TBI: unable to insonate  The doppler waveform of the study are monophasic bilateral.    Past Medical History:  Diagnosis Date   Cancer ([place])    skin    Past Surgical History:  Procedure Laterality Date   CHOLECYSTECTOMY      Allergies: Erythromycin, Macrolides and  ketolides, Penicillins, and Sulfa antibiotics  Medications: Prior to Admission medications   Medication Sig Start Date End Date Taking? Authorizing Provider  HYDROcodone-acetaminophen (NORCO/VICODIN) 5-325 MG per tablet Take 2 tablets by mouth every 4 (four) hours as needed for pain. 04/25/13   Rancour, Annie Main, MD  ibuprofen (ADVIL,MOTRIN) 200 MG tablet Take 400 mg by mouth every 6 (six) hours as needed for pain.    [provider]  levothyroxine (SYNTHROID, LEVOTHROID) 88 MCG tablet Take 88 mcg by mouth daily.    [provider]  rosuvastatin (CRESTOR) 5 MG tablet Take 5 mg by mouth daily.    [provider]  Vitamin D, Ergocalciferol, (DRISDOL) 50000 UNITS CAPS Take 50,000 Units by mouth.    [provider]     Family History  Problem Relation Age of Onset   Diabetes Mother    Alzheimer's disease Father     Social History   Socioeconomic History   Marital status: Married    Spouse name: Not on file   Number of children: Not on file   Years of education: Not on file   Highest education level: Not on file  Occupational History   Not on file  Tobacco Use   Smoking status: Every Day   Smokeless tobacco: Not on file  Substance and Sexual Activity   Alcohol  use: No   Drug use: No   Sexual activity: Not on file  Other Topics Concern   Not on file  Social History Narrative   Not on file   Social Determinants of Health   Financial Resource Strain: Not on file  Food Insecurity: Not on file  Transportation Needs: Not on file  Physical Activity: Not on file  Stress: Not on file  Social Connections: Not on file       Review of Systems: A 12 point ROS discussed and pertinent positives are indicated in the HPI above.  All other systems are negative.  Review of Systems  Vital Signs: BP (!) 159/62 (BP Location: Left Arm, Patient Position: Sitting, Cuff Size: Normal)   Pulse 60   Temp 98.2 F (36.8 C) (Oral)   Wt 83.5 kg   SpO2 97%  Comment: room air  Advance Care Plan: The advanced care plan/surrogate decision maker was discussed at the time of visit and documented in the medical record.    Physical Exam General: 82 yo female appearing stated age.  Well-developed, well-nourished.  No distress. HEENT: Atraumatic, normocephalic.  Conjugate gaze, extra-ocular motor intact. No scleral icterus or scleral injection. No lesions on external ears, nose, lips, or gums.  Oral mucosa moist, pink.  Neck: Symmetric with no goiter enlargement.  Chest/Lungs:  Symmetric chest with inspiration/expiration.  No labored breathing.  Clear to auscultation with no wheezes, rhonchi, or rales.  Heart:  RRR, with no third heart sounds appreciated. No JVD appreciated.  Abdomen:  Soft, NT/ND, with + bowel sounds.   Genito-urinary: Deferred Neurologic: Alert & Oriented to person, place, and time.   Normal affect and insight.  Appropriate questions.  Moving all 4 extremities  Pulse Exam:  No bruit appreciated.   Decreased right and left CFA pulses.  + doppler of the left and right PT and DP pulses.  monophasic Extremities: hypertrophic nails on the left and the right.  Worse on the left, particularly the left great toe.  Rubor of the left forefoot.  No wound.  Trophic changes of the bilateral feet, including hairless, dry flaking skin, and some fissuring.       Mallampati Score:     Imaging: VAS [place] ABI WITH/WO TBI  Result Date: 10/08/2022  LOWER EXTREMITY DOPPLER STUDY Patient Name:  Jenna Ryan  Date of Exam:   10/08/2022 Medical Rec #: [id number]       Accession #:    [id number] Date of Birth: 07-23-1941       Patient Gender: F Patient Age:   44 years Exam Location:  [facility] Procedure:      VAS [place] ABI WITH/WO TBI Referring Phys: Lennette Bihari PATEL --------------------------------------------------------------------------------  Indications: Peripheral artery disease. High Risk Factors: Current smoker.  Comparison Study: No prior  study Performing Technologist: Maudry Mayhew MHA, RDMS, RVT, RDCS  Examination Guidelines: A complete evaluation includes at minimum, Doppler waveform signals and systolic blood pressure reading at the level of bilateral brachial, anterior tibial, and posterior tibial arteries, when vessel segments are accessible. Bilateral testing is considered an integral part of a complete examination. Photoelectric Plethysmograph (PPG) waveforms and toe systolic pressure readings are included as required and additional duplex testing as needed. Limited examinations for reoccurring indications may be performed as noted.  ABI Findings: +---------+------------------+-----+----------+--------+ Right    Rt Pressure (mmHg)IndexWaveform  Comment  +---------+------------------+-----+----------+--------+ Brachial 147                                       +---------+------------------+-----+----------+--------+  PTA      74                0.50 monophasic         +---------+------------------+-----+----------+--------+ DP       75                0.51 biphasic           +---------+------------------+-----+----------+--------+ Great Toe32                0.22                    +---------+------------------+-----+----------+--------+ +---------+------------------+-----+------------------+-------+ Left     Lt Pressure (mmHg)IndexWaveform          Comment +---------+------------------+-----+------------------+-------+ Brachial 131                                              +---------+------------------+-----+------------------+-------+ PTA      45                0.31 monophasic                +---------+------------------+-----+------------------+-------+ DP       45                0.31 monophasic                +---------+------------------+-----+------------------+-------+ Great Toe                       Unable to insonate         +---------+------------------+-----+------------------+-------+ +-------+-----------+-----------+------------+------------+ ABI/TBIToday's ABIToday's TBIPrevious ABIPrevious TBI +-------+-----------+-----------+------------+------------+ Right  0.51       0.22                                +-------+-----------+-----------+------------+------------+ Left   0.31       0.00                                +-------+-----------+-----------+------------+------------+  Findings reported to [facility] (Dr. Serita Grit office) at 1356 10/08/2022. Summary: Right: Resting right ankle-brachial index indicates moderate, borderline severe, right lower extremity arterial disease. The right toe-brachial index is abnormal. Left: Resting left ankle-brachial index indicates severe, borderline critical, left lower extremity arterial disease. The left toe-brachial index is abnormal. *See table(s) above for measurements and observations.  Electronically signed by Servando Snare MD on 10/08/2022 at 4:29:58 PM.    Final     Labs:  CBC: No results for input(s): "WBC", "HGB", "HCT", "PLT" in the last 8760 hours.  COAGS: No results for input(s): "INR", "APTT" in the last 8760 hours.  BMP: No results for input(s): "NA", "K", "CL", "CO2", "GLUCOSE", "BUN", "CALCIUM", "CREATININE", "GFRNONAA", "GFRAA" in the last 8760 hours.  Invalid input(s): "CMP"  LIVER FUNCTION TESTS: No results for input(s): "BILITOT", "AST", "ALT", "ALKPHOS", "PROT", "ALBUMIN" in the last 8760 hours.  TUMOR MARKERS: No results for input(s): "AFPTM", "CEA", "CA199", "CHROMGRNA" in the last 8760 hours.  Assessment and Plan:  Assessment:  Jenna Ryan is a 82yo female presenting with Rutherford 4 class symptoms of the left lower extremity, great toe and forefoot.    Non-invasive lower extremity exam shows the right ABI 0.51 in the moderate range severity, and the left  0.31, severe range severity.  The left TBI is unable to insonate, which tells  [place] that she has poor wound-healing capability of the foot.   I had a lengthy discussion with Jenna Ryan regarding anatomy, pathology/pathophysiology, natural history, and prognosis of PAD/CLI.  Informed consent regarding treatment strategies was performed which would possibly include medical management,   surgical strategy, and/or endovascular options, with risk/benefit discussion.      I emphasized to her that currently she has poor wound healing capability with ABI of 0.31, and that if she were to heal any forefoot surgery, her resting pain would not likely improve.    Regarding medical management of PAD, maximal medical therapy for reduction of risk factors is indicated as recommended by updated AHA guidelines1.  This includes anti-platelet medication, tight blood glucose control to a HbA1c < 7, tight blood pressure control, maximum-dose HMG-CoA reductase inhibitor, and smoking cessation.  Smoking cessation was addressed, with strategies including counseling and nicotine replacement.  She does not seem interested at this time with attempting cessation.   Regarding endovascular options for improving blood flow, specific risks discussed include: bleeding, infection, contrast reaction, renal injury/nephropathy, arterial injury/dissection, need for additional procedure/surgery, worsening symptoms/tissue including limb loss, cardiopulmonary collapse, death.    At the end of our discussion, she was not yet ready to commit to any endovascular approach. She would Ryan to discuss with her family.   Plan: - Jenna Ryan understands our recommendation for angiogram and possible intervention for treatment of Rutherford 4 class symptoms of PAD.   She is going to discuss with her family and call our office back.  If she would Ryan to proceed with endo treatment before any planned foot surgery, we will order cross-sectional anatomic imaging with CTA run-off for pre-operative planning. -Recommend maximal medical  therapy for cardiovascular risk reduction, including anti-platelet therapy and anti-lipid medication.  I did encourage her to discuss with her physician. -Recommend initiating smoking cessation measures, with options discussed.    ___________________________________________________________________   1Morley Kos MD, et al. 2016 AHA/ACC Guideline on the Management of Patients With Lower Extremity Peripheral Artery Disease: Executive Summary: A Report of the American College of Cardiology/American Heart Association Task Force on Clinical Practice Guidelines. J Am Coll Cardiol. 2017 Mar 21;69(11):1465-1508. doi: 10.1016/j.jacc.2016.11.008.   2 - Norgren L, et al. TASC II Working Group. Inter-society consensus for the management of peripheral arterial disease. Int Tressia Miners. 2007 Jun;26(2):81-157. Review. PubMed PMID: [id number]  3 - Hingorani A, et al. The management of diabetic foot: A clinical practice guideline by the Society for Vascular Surgery in collaboration with the [place] and the Society  for Vascular Medicine. J Vasc Surg. 2016 Feb;63(2 Suppl):3S-21S. doi: 10.1016/j.jvs.2015.10.003. PubMed PMID: [id number].  4 - Corinna Gab, Saab FA, Luberta Mutter, Grant Ruts, Ewell Poe, Driver VR, [place], Lookstein R, van den Baldemar Lenis, Jaff MR, [place] Dawn, Henao S, AlMahameed A, Katzen B. Digital Subtraction Angiography Prior to an Amputation for Critical Limb Ischemia (CLI): An Expert Recommendation Statement From the CLI Global Society to Optimize Limb Salvage. J Endovasc Ther. 2020 Aug;27(4):540-546. doi: 10.1177/1526602820928590. Epub 2020 May 29. PMID: [id number].    Thank you for this interesting consult.  I greatly enjoyed meeting Jenna Ryan and look forward to participating in their care.  A copy of this report was sent to the requesting provider on this date.  Electronically Signed: Corrie Mckusick 10/31/2022, 11:42 AM   I spent a total of  60 Minutes   in face  to face  in clinical consultation, greater than 50% of which was counseling/coordinating care for CLTI/PAD, left foot rutherford 4 class symptoms, possible angiogram/intevention

## 2022-11-04 DIAGNOSIS — R0981 Nasal congestion: Secondary | ICD-10-CM | POA: Diagnosis not present

## 2022-11-04 DIAGNOSIS — R69 Illness, unspecified: Secondary | ICD-10-CM | POA: Diagnosis not present

## 2022-11-04 DIAGNOSIS — J069 Acute upper respiratory infection, unspecified: Secondary | ICD-10-CM | POA: Diagnosis not present

## 2022-11-04 DIAGNOSIS — R059 Cough, unspecified: Secondary | ICD-10-CM | POA: Diagnosis not present

## 2022-11-04 DIAGNOSIS — J449 Chronic obstructive pulmonary disease, unspecified: Secondary | ICD-10-CM | POA: Diagnosis not present

## 2022-11-04 DIAGNOSIS — Z1152 Encounter for screening for COVID-19: Secondary | ICD-10-CM | POA: Diagnosis not present

## 2022-11-04 DIAGNOSIS — J3089 Other allergic rhinitis: Secondary | ICD-10-CM | POA: Diagnosis not present

## 2022-11-04 DIAGNOSIS — H9202 Otalgia, left ear: Secondary | ICD-10-CM | POA: Diagnosis not present

## 2022-11-06 ENCOUNTER — Other Ambulatory Visit: Payer: Self-pay | Admitting: Interventional Radiology

## 2022-11-06 DIAGNOSIS — I739 Peripheral vascular disease, unspecified: Secondary | ICD-10-CM

## 2022-11-07 ENCOUNTER — Ambulatory Visit
Admission: RE | Admit: 2022-11-07 | Discharge: 2022-11-07 | Disposition: A | Discharge status: Home / Self Care | Payer: Medicare HMO | Source: Ambulatory Visit | Attending: Interventional Radiology | Admitting: Interventional Radiology

## 2022-11-07 DIAGNOSIS — M79671 Pain in right foot: Secondary | ICD-10-CM | POA: Diagnosis not present

## 2022-11-07 DIAGNOSIS — K5730 Diverticulosis of large intestine without perforation or abscess without bleeding: Secondary | ICD-10-CM | POA: Diagnosis not present

## 2022-11-07 DIAGNOSIS — S22080A Wedge compression fracture of T11-T12 vertebra, initial encounter for closed fracture: Secondary | ICD-10-CM | POA: Diagnosis not present

## 2022-11-07 DIAGNOSIS — I739 Peripheral vascular disease, unspecified: Secondary | ICD-10-CM

## 2022-11-07 DIAGNOSIS — M79672 Pain in left foot: Secondary | ICD-10-CM | POA: Diagnosis not present

## 2022-11-07 MED ORDER — IOPAMIDOL (ISOVUE-370) INJECTION 76%
100.0000 mL | Freq: Once | INTRAVENOUS | Status: AC | PRN
  Administered 2022-11-07: 100 mL via INTRAVENOUS

## 2022-11-10 ENCOUNTER — Other Ambulatory Visit: Payer: Self-pay | Admitting: Interventional Radiology

## 2022-11-10 DIAGNOSIS — I739 Peripheral vascular disease, unspecified: Secondary | ICD-10-CM

## 2022-11-11 ENCOUNTER — Ambulatory Visit: Payer: Medicare HMO | Admitting: Podiatry

## 2022-11-13 ENCOUNTER — Ambulatory Visit
Admission: RE | Admit: 2022-11-13 | Discharge: 2022-11-13 | Disposition: A | Discharge status: Home / Self Care | Payer: Medicare HMO | Source: Ambulatory Visit | Attending: Interventional Radiology | Admitting: Interventional Radiology

## 2022-11-13 DIAGNOSIS — M79662 Pain in left lower leg: Secondary | ICD-10-CM | POA: Diagnosis not present

## 2022-11-13 DIAGNOSIS — I739 Peripheral vascular disease, unspecified: Secondary | ICD-10-CM | POA: Diagnosis not present

## 2022-11-13 HISTORY — PX: IR RADIOLOGIST EVAL & MGMT: IMG5224

## 2022-11-13 NOTE — Progress Notes (Signed)
Chief Complaint: Left great toe pain   Referring Physician(s): Patel,Kevin P   PCP: Dr. Dagmar Hait, [facility]   History of Present Illness: Jenna Ryan is a 82 y.o. female presenting today to [place] [facility], history of PAD, left foot/great toe pain, and candidacy for possible revascularization.    Since our recent intake appointment, we have a CTA to review, as well as possible surgical plan to discuss with Jenna Ryan.  Jenna Ryan joins [place] today by herself on the telephone for the follow up visit.   History:  For the past several weeks to months she has been having increasing pain in her left toe and forefoot, with associated pain that extends along the plantar aspect of the left foot towards the heel.  She also describes some "numbness" of the forefoot involving the first through fourth toes.   The pain is there typically daily and has been worsening.     She does not do much ambulating around the house or neighborhood.  She has a neighbor that gets her mail for her.  She does not seem to be ambulating far enough to create claudication.     She denies history of MI or stroke.  She does not have a history of CHF.  She was treated more than 10 years ago for a superficial skin cancer of the left lower extremity.     She is a widow, as her husband passed about 3 years ago.  She had 1 son who passed a few years ago at the age of 29 (she states adopted).  She lives by herself.  She has 1 brother and 1 sister.  Her sister, Jim Like, is her POA.  She also has a niece from her husband's family who is a Marine scientist and is involved with her health care decision making.    Cardiovascular risk factors include: Smoking (1ppd since 82yo), HTN,    Non-invasive exam:  Right ABI: 0.51 Right TBI:0.22 Left ABI: 0.31 Left TBI: unable to insonate   The doppler waveform of the study are monophasic bilateral.   Interval History:  After our last visit, Jenna Ryan called our clinic back  after considering, and requested that we get a pre-operative CTA to pursue treatment.   This was performed 11/07/22, and we set up today to discuss.   Today, she confirms that she has no new symptoms, and that she continues to have left foot pain.   The CTA shows multi-segment disease, including likely hemodynamically significant stenosis at the left CIA origin, as well as fem-pop disease at the adductor canal.    Past Medical History:  Diagnosis Date   Cancer ([place])    skin    Past Surgical History:  Procedure Laterality Date   CHOLECYSTECTOMY     IR RADIOLOGIST EVAL & MGMT  10/31/2022    Allergies: Erythromycin, Macrolides and ketolides, Penicillins, and Sulfa antibiotics  Medications: Prior to Admission medications   Medication Sig Start Date End Date Taking? Authorizing Provider  HYDROcodone-acetaminophen (NORCO/VICODIN) 5-325 MG per tablet Take 2 tablets by mouth every 4 (four) hours as needed for pain. 04/25/13   Rancour, Annie Main, MD  ibuprofen (ADVIL,MOTRIN) 200 MG tablet Take 400 mg by mouth every 6 (six) hours as needed for pain.    [provider]  levothyroxine (SYNTHROID, LEVOTHROID) 88 MCG tablet Take 88 mcg by mouth daily.    [provider]  rosuvastatin (CRESTOR) 5 MG tablet Take 5 mg by mouth daily.  [provider]  Vitamin D, Ergocalciferol, (DRISDOL) 50000 UNITS CAPS Take 50,000 Units by mouth.    [provider]     Family History  Problem Relation Age of Onset   Diabetes Mother    Alzheimer's disease Father     Social History   Socioeconomic History   Marital status: Married    Spouse name: Not on file   Number of children: Not on file   Years of education: Not on file   Highest education level: Not on file  Occupational History   Not on file  Tobacco Use   Smoking status: Every Day   Smokeless tobacco: Not on file  Substance and Sexual Activity   Alcohol use: No   Drug use: No   Sexual activity: Not on file   Other Topics Concern   Not on file  Social History Narrative   Not on file   Social Determinants of Health   Financial Resource Strain: Not on file  Food Insecurity: Not on file  Transportation Needs: Not on file  Physical Activity: Not on file  Stress: Not on file  Social Connections: Not on file       Review of Systems  Review of Systems: A 12 point ROS discussed and pertinent positives are indicated in the HPI above.  All other systems are negative.  Advance Care Plan: The advanced care plan/surrogate decision maker was discussed at the time of visit and documented in the medical record.    Physical Exam No direct physical exam was performed (except for noted visual exam findings with Video Visits).    Vital Signs: There were no vitals taken for this visit.  Imaging: CT ANGIO AO+BIFEM W & OR WO CONTRAST  Result Date: 11/08/2022 CLINICAL DATA:  Bilateral foot pain x1 month EXAM: CT ANGIOGRAPHY OF ABDOMINAL AORTA WITH ILIOFEMORAL RUNOFF TECHNIQUE: Multidetector CT imaging of the abdomen, pelvis and lower extremities was performed using the standard protocol during bolus administration of intravenous contrast. Multiplanar CT image reconstructions and MIPs were obtained to evaluate the vascular anatomy. RADIATION DOSE REDUCTION: This exam was performed according to the departmental dose-optimization program which includes automated exposure control, adjustment of the mA and/or kV according to patient size and/or use of iterative reconstruction technique. CONTRAST:  167mL ISOVUE-370 IOPAMIDOL (ISOVUE-370) INJECTION 76% COMPARISON:  CT 11/16/2018 FINDINGS: VASCULAR Aorta: Moderate partially calcified atheromatous plaque throughout. No aneurysm, dissection, or stenosis. Celiac: Patent without evidence of aneurysm, dissection, vasculitis or significant stenosis. SMA: Calcified ostial plaque with only mild short-segment stenosis, atheromatous but patent distally with classic distal  branch anatomy. Renals: Single left, with calcified ostial plaque resulting in short segment stenosis of at least mild severity, patent distally. Single right, with scattered calcified plaque proximally, no significant stenosis. IMA: Patent without evidence of aneurysm, dissection, vasculitis or significant stenosis. RIGHT Lower Extremity Inflow: Common iliac: Calcified ostial plaque resulting in short segment mild stenosis, atheromatous but patent distally. Internal iliac atheromatous, patent. External iliac diffusely atheromatous with short-segment stenosis in its midportion of at least moderate severity, patent distally. Outflow: Common femoral atheromatous, patent. Deep femoral branches patent. SFA scattered plaque with short-segment proximal stenosis of at least mild severity. Popliteal scattered plaque with proximal short-segment stenosis of at least mild severity. The midportion is incompletely evaluated because a significant streak artifact from knee arthroplasty hardware. Runoff: Peroneal occludes mid calf. Posterior tibial occludes distal calf. Anterior tibial is contiguous across the foot as dorsalis pedis. LEFT Lower Extremity Inflow: Common iliac ostial calcified  plaque resulting in short segment stenosis of at least moderate severity, atheromatous but patent distally. Internal iliac moderately atheromatous, patent. External iliac atheromatous, patent. Outflow: Common femoral atheromatous, patent. Deep femoral branches patent. SFA scattered atheromatous plaque, high-grade stenosis or short segment occlusion at the adductor hiatus. Popliteal reconstituted proximally by collaterals, mildly atheromatous but patent distally. Runoff: Patent three vessel runoff to the ankle. Veins: No obvious venous abnormality within the limitations of this arterial phase study. Patent portal and renal veins. Review of the MIP images confirms the above findings. NON-VASCULAR Lower chest: No pleural or pericardial effusion.  Coronary calcifications. Visualized lung bases clear. Hepatobiliary: No focal liver abnormality is seen. Status post cholecystectomy. No biliary dilatation. Pancreas: Unremarkable. No pancreatic ductal dilatation or surrounding inflammatory changes. Spleen: Normal in size without focal abnormality. Adrenals/Urinary Tract: Adrenal glands are unremarkable. Kidneys are normal, without renal calculi, focal lesion, or hydronephrosis. Bladder is unremarkable. Stomach/Bowel: Stomach is incompletely distended, unremarkable. Small bowel decompressed. Normal appendix. The colon is partially distended by gas and fluid, with scattered distal descending and numerous sigmoid diverticula; no significant adjacent inflammatory change. Lymphatic: No abdominal or pelvic adenopathy. Reproductive: Uterus and bilateral adnexa are unremarkable. Other: No ascites.  No free air. Musculoskeletal: Stable T12 compression fracture deformity. Multilevel spondylitic change in the lumbar spine. Right knee arthroplasty hardware without evident complication. IMPRESSION: 1. Bilateral common iliac artery stenoses of possible hemodynamic significance. 2. RIGHT external iliac artery stenosis of at least moderate severity. 3. RIGHT SFA and popliteal artery stenoses of at least mild severity, with anterior tibial runoff. 4. LEFT SFA high-grade stenosis or short segment occlusion at the adductor hiatus, with reconstitution of popliteal artery and three-vessel tibial runoff. 5. Colonic diverticulosis. 6.  Aortic Atherosclerosis (ICD10-I70.0). Electronically Signed   By: Lucrezia Europe M.D.   On: 11/08/2022 11:50   IR Radiologist Eval & Mgmt  Result Date: 10/31/2022 EXAM: NEW PATIENT OFFICE VISIT CHIEF COMPLAINT: Electronic medical record HISTORY OF PRESENT ILLNESS: Electronic medical record REVIEW OF SYSTEMS: Electronic medical record PHYSICAL EXAMINATION: Electronic medical record ASSESSMENT AND PLAN: Electronic medical record Electronically Signed   By:  Corrie Mckusick D.O.   On: 10/31/2022 16:13    Labs:  CBC: No results for input(s): "WBC", "HGB", "HCT", "PLT" in the last 8760 hours.  COAGS: No results for input(s): "INR", "APTT" in the last 8760 hours.  BMP: No results for input(s): "NA", "K", "CL", "CO2", "GLUCOSE", "BUN", "CALCIUM", "CREATININE", "GFRNONAA", "GFRAA" in the last 8760 hours.  Invalid input(s): "CMP"  LIVER FUNCTION TESTS: No results for input(s): "BILITOT", "AST", "ALT", "ALKPHOS", "PROT", "ALBUMIN" in the last 8760 hours.  TUMOR MARKERS: No results for input(s): "AFPTM", "CEA", "CA199", "CHROMGRNA" in the last 8760 hours.  Assessment and Plan:  Assessment:  Jenna Ryan is a 82yo female presenting with [place] 4 class symptoms of the left lower extremity, great toe and forefoot.     Non-invasive lower extremity exam shows the right ABI 0.51 in the moderate range severity, and the left 0.31, severe range severity.  The left TBI is unable to insonate, which tells [place] that she has poor wound-healing capability of the foot.    Today we have a CTA result to review with her for possible surgical strategy, which shows multi-segment disease of the left iliac and left fem-pop segments, contributing to her symptoms.  I discussed this with her.     We reviewed that she not only has resting pain, but she has poor wound healing capability with ABI of 0.31.  If she were to heal any forefoot surgery, her resting pain would not likely improve.   Currently, she is not at imminent risk of amputation, which I described to her.  Today I emphasized to her that treating her symptoms of PAD comes down to her own risk/benefit assessment, as she stands to have improved symptoms, but there is a risk profile that goes along with any procedure such as this.  We again reviewed endovascular options for improving blood flow, including the risk profile: bleeding, infection, contrast reaction, renal injury/nephropathy, arterial injury/dissection,  need for additional procedure/surgery, worsening symptoms/tissue including limb loss, cardiopulmonary collapse, death.    Based on the CTA result, I would anticipate angiogram from [facility] access, pressure measurements, and based on the angio of the left run-off, consider simply treating the aorto-iliac disease vs aorto-iliac & left fem pop segment.    At the end of our discussion, she would like to consider further and call us back.     Plan: - Jenna Ryan is going to consider possible endovascular therapy, and she would like to call us back.  Possible aorto-iliac angiogram with runoff, possible left lower extremity intervention. If she would like to proceed, we can set this up at [facility] with Dr. Earleen Newport with moderate sedation.  -Continue maximal medical therapy for cardiovascular risk reduction, including anti-platelet therapy and anti-lipid medication.  I did encourage her to discuss with her physician. -Recommend initiating smoking cessation measures  Electronically Signed: Corrie Mckusick 11/13/2022, 9:45 AM   I spent a total of    25 Minutes in remote  clinical consultation, greater than 50% of which was counseling/coordinating care for PAD, left lower extremity symptoms of rest pain, possible angiogram and intervention.    Visit type: Audio only (telephone). Audio (no video) only due to patient's lack of internet/smartphone capability. Alternative for in-person consultation at [facility], [address] [place], [place], [place]. This visit type was conducted due to national recommendations for restrictions regarding the COVID-19 Pandemic (e.g. social distancing).  This format is felt to be most appropriate for this patient at this time.  All issues noted in this document were discussed and addressed.

## 2022-11-14 ENCOUNTER — Other Ambulatory Visit: Payer: Medicare HMO

## 2022-11-14 DIAGNOSIS — J439 Emphysema, unspecified: Secondary | ICD-10-CM | POA: Diagnosis not present

## 2022-11-14 DIAGNOSIS — Z8249 Family history of ischemic heart disease and other diseases of the circulatory system: Secondary | ICD-10-CM | POA: Diagnosis not present

## 2022-11-14 DIAGNOSIS — Z85828 Personal history of other malignant neoplasm of skin: Secondary | ICD-10-CM | POA: Diagnosis not present

## 2022-11-14 DIAGNOSIS — E039 Hypothyroidism, unspecified: Secondary | ICD-10-CM | POA: Diagnosis not present

## 2022-11-14 DIAGNOSIS — F17200 Nicotine dependence, unspecified, uncomplicated: Secondary | ICD-10-CM | POA: Diagnosis not present

## 2022-11-14 DIAGNOSIS — I739 Peripheral vascular disease, unspecified: Secondary | ICD-10-CM | POA: Diagnosis not present

## 2022-11-14 DIAGNOSIS — Z880 Allergy status to penicillin: Secondary | ICD-10-CM | POA: Diagnosis not present

## 2022-11-14 DIAGNOSIS — F4320 Adjustment disorder, unspecified: Secondary | ICD-10-CM | POA: Diagnosis not present

## 2022-11-14 DIAGNOSIS — Z882 Allergy status to sulfonamides status: Secondary | ICD-10-CM | POA: Diagnosis not present

## 2022-11-14 DIAGNOSIS — I10 Essential (primary) hypertension: Secondary | ICD-10-CM | POA: Diagnosis not present

## 2022-11-14 DIAGNOSIS — R69 Illness, unspecified: Secondary | ICD-10-CM | POA: Diagnosis not present

## 2022-11-14 DIAGNOSIS — Z809 Family history of malignant neoplasm, unspecified: Secondary | ICD-10-CM | POA: Diagnosis not present

## 2022-11-18 ENCOUNTER — Other Ambulatory Visit (HOSPITAL_COMMUNITY): Payer: Self-pay | Admitting: Interventional Radiology

## 2022-11-18 ENCOUNTER — Other Ambulatory Visit (HOSPITAL_COMMUNITY): Payer: Medicare HMO

## 2022-11-18 DIAGNOSIS — I739 Peripheral vascular disease, unspecified: Secondary | ICD-10-CM

## 2022-11-20 ENCOUNTER — Other Ambulatory Visit: Payer: Self-pay | Admitting: Radiology

## 2022-11-20 DIAGNOSIS — I739 Peripheral vascular disease, unspecified: Secondary | ICD-10-CM

## 2022-11-25 ENCOUNTER — Other Ambulatory Visit: Payer: Self-pay | Admitting: Radiology

## 2022-11-25 ENCOUNTER — Other Ambulatory Visit: Payer: Self-pay | Admitting: Student

## 2022-11-26 ENCOUNTER — Other Ambulatory Visit: Payer: Self-pay

## 2022-11-26 ENCOUNTER — Other Ambulatory Visit (HOSPITAL_COMMUNITY): Payer: Self-pay | Admitting: Interventional Radiology

## 2022-11-26 ENCOUNTER — Other Ambulatory Visit: Payer: Self-pay | Admitting: Student

## 2022-11-26 ENCOUNTER — Other Ambulatory Visit: Payer: Self-pay | Admitting: Internal Medicine

## 2022-11-26 ENCOUNTER — Encounter (HOSPITAL_COMMUNITY): Payer: Self-pay

## 2022-11-26 ENCOUNTER — Ambulatory Visit (HOSPITAL_COMMUNITY)
Admission: RE | Admit: 2022-11-26 | Discharge: 2022-11-26 | Disposition: A | Discharge status: Home / Self Care | Payer: Medicare HMO | Source: Ambulatory Visit | Attending: Interventional Radiology | Admitting: Interventional Radiology

## 2022-11-26 DIAGNOSIS — M79675 Pain in left toe(s): Secondary | ICD-10-CM | POA: Insufficient documentation

## 2022-11-26 DIAGNOSIS — I70222 Atherosclerosis of native arteries of extremities with rest pain, left leg: Secondary | ICD-10-CM | POA: Diagnosis present

## 2022-11-26 DIAGNOSIS — I739 Peripheral vascular disease, unspecified: Secondary | ICD-10-CM

## 2022-11-26 DIAGNOSIS — F17200 Nicotine dependence, unspecified, uncomplicated: Secondary | ICD-10-CM | POA: Diagnosis not present

## 2022-11-26 DIAGNOSIS — R69 Illness, unspecified: Secondary | ICD-10-CM | POA: Diagnosis not present

## 2022-11-26 DIAGNOSIS — I70223 Atherosclerosis of native arteries of extremities with rest pain, bilateral legs: Secondary | ICD-10-CM | POA: Diagnosis not present

## 2022-11-26 HISTORY — PX: IR US GUIDE VASC ACCESS LEFT: IMG2389

## 2022-11-26 HISTORY — PX: IR US GUIDE VASC ACCESS RIGHT: IMG2390

## 2022-11-26 HISTORY — PX: IR TRANSCATH PLC STENT 1ST ART NOT LE CV CAR VERT CAR: IMG5443

## 2022-11-26 HISTORY — PX: IR ANGIOGRAM PELVIS SELECTIVE OR SUPRASELECTIVE: IMG661

## 2022-11-26 HISTORY — PX: IR ANGIOGRAM EXTREMITY BILATERAL: IMG653

## 2022-11-26 LAB — CBC
HCT: 36.3 % (ref 36.0–46.0)
Hemoglobin: 11.9 g/dL — ABNORMAL LOW (ref 12.0–15.0)
MCH: 32.1 pg (ref 26.0–34.0)
MCHC: 32.8 g/dL (ref 30.0–36.0)
MCV: 97.8 fL (ref 80.0–100.0)
Platelets: 157 10*3/uL (ref 150–400)
RBC: 3.71 MIL/uL — ABNORMAL LOW (ref 3.87–5.11)
RDW: 13 % (ref 11.5–15.5)
WBC: 7.6 10*3/uL (ref 4.0–10.5)
nRBC: 0 % (ref 0.0–0.2)

## 2022-11-26 LAB — BASIC METABOLIC PANEL
Anion gap: 8 (ref 5–15)
BUN: 25 mg/dL — ABNORMAL HIGH (ref 8–23)
CO2: 26 mmol/L (ref 22–32)
Calcium: 9.2 mg/dL (ref 8.9–10.3)
Chloride: 103 mmol/L (ref 98–111)
Creatinine, Ser: 1.57 mg/dL — ABNORMAL HIGH (ref 0.44–1.00)
GFR, Estimated: 32 mL/min — ABNORMAL LOW (ref >60)
Glucose, Bld: 100 mg/dL — ABNORMAL HIGH (ref 70–99)
Potassium: 3.9 mmol/L (ref 3.5–5.1)
Sodium: 137 mmol/L (ref 135–145)

## 2022-11-26 LAB — PROTIME-INR
INR: 1 (ref 0.8–1.2)
Prothrombin Time: 12.8 seconds (ref 11.4–15.2)

## 2022-11-26 MED ORDER — ASPIRIN 81 MG PO TBEC: 81.0000 mg | DELAYED_RELEASE_TABLET | Freq: Every day | ORAL | 12 refills | Status: AC

## 2022-11-26 MED ORDER — ASPIRIN 325 MG PO TABS
650.0000 mg | ORAL_TABLET | Freq: Every day | ORAL | Status: DC
  Administered 2022-11-26: 650 mg via ORAL

## 2022-11-26 MED ORDER — LACTATED RINGERS IV SOLN: INTRAVENOUS | Status: AC

## 2022-11-26 MED ORDER — FENTANYL CITRATE (PF) 100 MCG/2ML IJ SOLN
INTRAMUSCULAR | Status: AC | PRN
  Administered 2022-11-26 (×2): 25 ug via INTRAVENOUS

## 2022-11-26 MED ORDER — ASPIRIN 325 MG PO TABS
ORAL_TABLET | ORAL | Status: AC
  Filled 2022-11-26: qty 2

## 2022-11-26 MED ORDER — HEPARIN SODIUM (PORCINE) 1000 UNIT/ML IJ SOLN
INTRAMUSCULAR | Status: AC
  Filled 2022-11-26: qty 10

## 2022-11-26 MED ORDER — CLOPIDOGREL BISULFATE 300 MG PO TABS
ORAL_TABLET | ORAL | Status: AC
  Filled 2022-11-26: qty 1

## 2022-11-26 MED ORDER — IOHEXOL 300 MG/ML  SOLN: 100.0000 mL | Freq: Once | INTRAMUSCULAR | Status: DC | PRN

## 2022-11-26 MED ORDER — CLOPIDOGREL BISULFATE 75 MG PO TABS
300.0000 mg | ORAL_TABLET | Freq: Every day | ORAL | Status: DC
  Administered 2022-11-26: 300 mg via ORAL

## 2022-11-26 MED ORDER — MIDAZOLAM HCL 2 MG/2ML IJ SOLN
INTRAMUSCULAR | Status: AC | PRN
  Administered 2022-11-26: 1 mg via INTRAVENOUS

## 2022-11-26 MED ORDER — HEPARIN SODIUM (PORCINE) 1000 UNIT/ML IJ SOLN
INTRAMUSCULAR | Status: AC | PRN
  Administered 2022-11-26: 4000 [IU] via INTRAVENOUS

## 2022-11-26 MED ORDER — ASPIRIN 81 MG PO TBEC: 81.0000 mg | DELAYED_RELEASE_TABLET | Freq: Every day | ORAL | Status: DC

## 2022-11-26 MED ORDER — CLOPIDOGREL BISULFATE 75 MG PO TABS: 75.0000 mg | ORAL_TABLET | Freq: Every day | ORAL | 2 refills | Status: DC

## 2022-11-26 MED ORDER — FENTANYL CITRATE (PF) 100 MCG/2ML IJ SOLN
INTRAMUSCULAR | Status: AC
  Filled 2022-11-26: qty 2

## 2022-11-26 MED ORDER — IODIXANOL 320 MG/ML IV SOLN
100.0000 mL | Freq: Once | INTRAVENOUS | Status: AC | PRN
  Administered 2022-11-26: 6 mL via INTRA_ARTERIAL

## 2022-11-26 MED ORDER — SODIUM CHLORIDE 0.9 % IV SOLN: INTRAVENOUS | Status: DC

## 2022-11-26 MED ORDER — LIDOCAINE HCL 1 % IJ SOLN
INTRAMUSCULAR | Status: AC
  Filled 2022-11-26: qty 20

## 2022-11-26 MED ORDER — MIDAZOLAM HCL 2 MG/2ML IJ SOLN
INTRAMUSCULAR | Status: AC
  Filled 2022-11-26: qty 2

## 2022-11-26 NOTE — Sedation Documentation (Signed)
Pre- stent pressures:  Right external iliac artery 54/37 (46) Aorta 120/42 (71) Left external iliac 64/36 (49)  Post stent pressures:  Left external iliac 99/39 (62) Right external iliac 89/40 (60) Aorta 118/40 (70) Right external iliac 99/40 (63)

## 2022-11-26 NOTE — Sedation Documentation (Signed)
Patient's bilateral fem sites remain level 0 with gauze dressing clean/dry/intact. Patients pulses remain unchanged.  Handoff given to short stay.

## 2022-11-26 NOTE — Discharge Instructions (Signed)
Femoral Site Care This sheet gives you information about how to care for yourself after your procedure. Your health care provider may also give you more specific instructions. If you have problems or questions, contact your health care provider. What can I expect after the procedure?  After the procedure, it is common to have: Bruising that usually fades within 1-2 weeks. Tenderness at the site. Follow these instructions at home: Wound care Follow instructions from your health care provider about how to take care of your insertion site. Make sure you: Wash your hands with soap and water before you change your bandage (dressing). If soap and water are not available, use hand sanitizer. Remove your dressing as told by your health care provider. In 24 hours Do not take baths, swim, or use a hot tub until your health care provider approves. You may shower 24-48 hours after the procedure or as told by your health care provider. Gently wash the site with plain soap and water. Pat the area dry with a clean towel. Do not rub the site. This may cause bleeding. Do not apply powder or lotion to the site. Keep the site clean and dry. Check your femoral site every day for signs of infection. Check for: Redness, swelling, or pain. Fluid or blood. Warmth. Pus or a bad smell. Activity For the first 2-3 days after your procedure, or as long as directed: Avoid climbing stairs as much as possible. Do not squat. Do not lift anything that is heavier than 10 lb (4.5 kg), or the limit that you are told, until your health care provider says that it is safe. For 5 days Rest as directed. Avoid sitting for a long time without moving. Get up to take short walks every 1-2 hours. Do not drive for 24 hours if you were given a medicine to help you relax (sedative). General instructions Take over-the-counter and prescription medicines only as told by your health care provider. Keep all follow-up visits as told by  your health care provider. This is important. Contact a health care provider if you have: A fever or chills. You have redness, swelling, or pain around your insertion site. Get help right away if: The catheter insertion area swells very fast. You pass out. You suddenly start to sweat or your skin gets clammy. The catheter insertion area is bleeding, and the bleeding does not stop when you hold steady pressure on the area. The area near or just beyond the catheter insertion site becomes pale, cool, tingly, or numb. These symptoms may represent a serious problem that is an emergency. Do not wait to see if the symptoms will go away. Get medical help right away. Call your local emergency services (911 in the [place] Do not drive yourself to the hospital. Summary After the procedure, it is common to have bruising that usually fades within 1-2 weeks. Check your femoral site every day for signs of infection. Do not lift anything that is heavier than 10 lb (4.5 kg), or the limit that you are told, until your health care provider says that it is safe. This information is not intended to replace advice given to you by your health care provider. Make sure you discuss any questions you have with your health care provider. Document Revised: 08/24/2017 Document Reviewed: 08/24/2017 Elsevier Patient Education  2020 [employer] 

## 2022-11-26 NOTE — H&P (Signed)
Chief Complaint: Patient was seen in consultation today for left great toe pain  Referring Physician(s): Dr. Boneta Lucks  Supervising Physician: Corrie Mckusick  Patient Status: [facility] - Out-pt  History of Present Illness: Jenna Ryan is a 82 y.o. female with history of PAD, left foot/great toe pain who is followed by Dr. Boneta Lucks.  She has recently experienced increasing pain in her toe/foot associated with numbness.  She has met with Dr. Earleen Newport in consultation to discuss management of her foot pain.  After discussion she elects to proceed with intervention.   Jenna Ryan presents to [facility] today in her usual state of health.  She is accompanied by her daughter. She lives at home with her sister, Jenna Ryan. She is aware of the goals of the procedure as well as the risks and benefits and is agreeable to proceed.  She is aware of plans to d/c home after procedure today.   Past Medical History:  Diagnosis Date   Cancer    skin    Past Surgical History:  Procedure Laterality Date   CHOLECYSTECTOMY     IR RADIOLOGIST EVAL & MGMT  10/31/2022   IR RADIOLOGIST EVAL & MGMT  11/13/2022    Allergies: Erythromycin, Macrolides and ketolides, Penicillins, and Sulfa antibiotics  Medications: Prior to Admission medications   Medication Sig Start Date End Date Taking? Authorizing Provider  acetaminophen (TYLENOL) 325 MG tablet Take 650 mg by mouth every 6 (six) hours as needed for moderate pain.   Yes [provider]  apixaban (ELIQUIS) 5 MG TABS tablet Take 5 mg by mouth 1 day or 1 dose.   Yes [provider]  levothyroxine (SYNTHROID, LEVOTHROID) 88 MCG tablet Take 88 mcg by mouth daily.   Yes [provider]  Vitamin D, Ergocalciferol, (DRISDOL) 50000 UNITS CAPS Take 50,000 Units by mouth.   Yes [provider]  HYDROcodone-acetaminophen (NORCO/VICODIN) 5-325 MG per tablet Take 2 tablets by mouth every 4 (four) hours as needed for pain. 04/25/13    Rancour, Annie Main, MD  ibuprofen (ADVIL,MOTRIN) 200 MG tablet Take 400 mg by mouth every 6 (six) hours as needed for pain.    [provider]  rosuvastatin (CRESTOR) 5 MG tablet Take 5 mg by mouth daily.    [provider]     Family History  Problem Relation Age of Onset   Diabetes Mother    Alzheimer's disease Father     Social History   Socioeconomic History   Marital status: Married    Spouse name: Not on file   Number of children: Not on file   Years of education: Not on file   Highest education level: Not on file  Occupational History   Not on file  Tobacco Use   Smoking status: Every Day   Smokeless tobacco: Not on file  Substance and Sexual Activity   Alcohol use: No   Drug use: No   Sexual activity: Not on file  Other Topics Concern   Not on file  Social History Narrative   Not on file   Social Determinants of Health   Financial Resource Strain: Not on file  Food Insecurity: Not on file  Transportation Needs: Not on file  Physical Activity: Not on file  Stress: Not on file  Social Connections: Not on file     Review of Systems: A 12 point ROS discussed and pertinent positives are indicated in the HPI above.  All other systems are negative.  Review of  Systems  Constitutional:  Negative for fatigue and fever.  Respiratory:  Negative for cough and shortness of breath.   Cardiovascular:  Negative for chest pain.  Gastrointestinal:  Negative for abdominal pain.  Musculoskeletal:  Negative for back pain.  Skin:  Negative for color change and wound.  Psychiatric/Behavioral:  Negative for behavioral problems and confusion.     Vital Signs: BP (!) 128/53   Pulse 60   Temp 98.2 F (36.8 C) (Oral)   Resp 18   Ht 5\' 5"  (1.651 m)   Wt 175 lb (79.4 kg)   SpO2 99%   BMI 29.12 kg/m   Physical Exam Vitals and nursing note reviewed.  Constitutional:      General: She is not in acute distress.    Appearance: Normal appearance. She is not  ill-appearing.  HENT:     Mouth/Throat:     Mouth: Mucous membranes are moist.     Pharynx: Oropharynx is clear.  Cardiovascular:     Rate and Rhythm: Normal rate and regular rhythm.  Pulmonary:     Effort: Pulmonary effort is normal.     Breath sounds: Normal breath sounds.  Abdominal:     General: Abdomen is flat.     Palpations: Abdomen is soft.  Neurological:     General: No focal deficit present.     Mental Status: She is alert and oriented to person, place, and time. Mental status is at baseline.  Psychiatric:        Mood and Affect: Mood normal.        Behavior: Behavior normal.        Thought Content: Thought content normal.        Judgment: Judgment normal.      MD Evaluation Airway: WNL Heart: WNL Abdomen: WNL Chest/ Lungs: WNL ASA  Classification: 3 Mallampati/Airway Score: Two   Imaging: IR Radiologist Eval & Mgmt  Result Date: 11/13/2022 EXAM: NEW PATIENT OFFICE VISIT CHIEF COMPLAINT: Electronic medical record HISTORY OF PRESENT ILLNESS: Electronic medical record REVIEW OF SYSTEMS: Electronic medical record PHYSICAL EXAMINATION: Electronic medical record ASSESSMENT AND PLAN: Electronic medical record Electronically Signed   By: Corrie Mckusick D.O.   On: 11/13/2022 09:58   CT ANGIO AO+BIFEM W & OR WO CONTRAST  Result Date: 11/08/2022 CLINICAL DATA:  Bilateral foot pain x1 month EXAM: CT ANGIOGRAPHY OF ABDOMINAL AORTA WITH ILIOFEMORAL RUNOFF TECHNIQUE: Multidetector CT imaging of the abdomen, pelvis and lower extremities was performed using the standard protocol during bolus administration of intravenous contrast. Multiplanar CT image reconstructions and MIPs were obtained to evaluate the vascular anatomy. RADIATION DOSE REDUCTION: This exam was performed according to the departmental dose-optimization program which includes automated exposure control, adjustment of the mA and/or kV according to patient size and/or use of iterative reconstruction technique.  CONTRAST:  169mL ISOVUE-370 IOPAMIDOL (ISOVUE-370) INJECTION 76% COMPARISON:  CT 11/16/2018 FINDINGS: VASCULAR Aorta: Moderate partially calcified atheromatous plaque throughout. No aneurysm, dissection, or stenosis. Celiac: Patent without evidence of aneurysm, dissection, vasculitis or significant stenosis. SMA: Calcified ostial plaque with only mild short-segment stenosis, atheromatous but patent distally with classic distal branch anatomy. Renals: Single left, with calcified ostial plaque resulting in short segment stenosis of at least mild severity, patent distally. Single right, with scattered calcified plaque proximally, no significant stenosis. IMA: Patent without evidence of aneurysm, dissection, vasculitis or significant stenosis. RIGHT Lower Extremity Inflow: Common iliac: Calcified ostial plaque resulting in short segment mild stenosis, atheromatous but patent distally. Internal iliac atheromatous, patent. External iliac  diffusely atheromatous with short-segment stenosis in its midportion of at least moderate severity, patent distally. Outflow: Common femoral atheromatous, patent. Deep femoral branches patent. SFA scattered plaque with short-segment proximal stenosis of at least mild severity. Popliteal scattered plaque with proximal short-segment stenosis of at least mild severity. The midportion is incompletely evaluated because a significant streak artifact from knee arthroplasty hardware. Runoff: Peroneal occludes mid calf. Posterior tibial occludes distal calf. Anterior tibial is contiguous across the foot as dorsalis pedis. LEFT Lower Extremity Inflow: Common iliac ostial calcified plaque resulting in short segment stenosis of at least moderate severity, atheromatous but patent distally. Internal iliac moderately atheromatous, patent. External iliac atheromatous, patent. Outflow: Common femoral atheromatous, patent. Deep femoral branches patent. SFA scattered atheromatous plaque, high-grade stenosis  or short segment occlusion at the adductor hiatus. Popliteal reconstituted proximally by collaterals, mildly atheromatous but patent distally. Runoff: Patent three vessel runoff to the ankle. Veins: No obvious venous abnormality within the limitations of this arterial phase study. Patent portal and renal veins. Review of the MIP images confirms the above findings. NON-VASCULAR Lower chest: No pleural or pericardial effusion. Coronary calcifications. Visualized lung bases clear. Hepatobiliary: No focal liver abnormality is seen. Status post cholecystectomy. No biliary dilatation. Pancreas: Unremarkable. No pancreatic ductal dilatation or surrounding inflammatory changes. Spleen: Normal in size without focal abnormality. Adrenals/Urinary Tract: Adrenal glands are unremarkable. Kidneys are normal, without renal calculi, focal lesion, or hydronephrosis. Bladder is unremarkable. Stomach/Bowel: Stomach is incompletely distended, unremarkable. Small bowel decompressed. Normal appendix. The colon is partially distended by gas and fluid, with scattered distal descending and numerous sigmoid diverticula; no significant adjacent inflammatory change. Lymphatic: No abdominal or pelvic adenopathy. Reproductive: Uterus and bilateral adnexa are unremarkable. Other: No ascites.  No free air. Musculoskeletal: Stable T12 compression fracture deformity. Multilevel spondylitic change in the lumbar spine. Right knee arthroplasty hardware without evident complication. IMPRESSION: 1. Bilateral common iliac artery stenoses of possible hemodynamic significance. 2. RIGHT external iliac artery stenosis of at least moderate severity. 3. RIGHT SFA and popliteal artery stenoses of at least mild severity, with anterior tibial runoff. 4. LEFT SFA high-grade stenosis or short segment occlusion at the adductor hiatus, with reconstitution of popliteal artery and three-vessel tibial runoff. 5. Colonic diverticulosis. 6.  Aortic Atherosclerosis  (ICD10-I70.0). Electronically Signed   By: Lucrezia Europe M.D.   On: 11/08/2022 11:50   IR Radiologist Eval & Mgmt  Result Date: 10/31/2022 EXAM: NEW PATIENT OFFICE VISIT CHIEF COMPLAINT: Electronic medical record HISTORY OF PRESENT ILLNESS: Electronic medical record REVIEW OF SYSTEMS: Electronic medical record PHYSICAL EXAMINATION: Electronic medical record ASSESSMENT AND PLAN: Electronic medical record Electronically Signed   By: Corrie Mckusick D.O.   On: 10/31/2022 16:13    Labs:  CBC: Recent Labs    11/26/22 0754  WBC 7.6  HGB 11.9*  HCT 36.3  PLT 157    COAGS: Recent Labs    11/26/22 0754  INR 1.0    BMP: Recent Labs    11/26/22 0754  NA 137  K 3.9  CL 103  CO2 26  GLUCOSE 100*  BUN 25*  CALCIUM 9.2  CREATININE 1.57*  GFRNONAA 32*    LIVER FUNCTION TESTS: No results for input(s): "BILITOT", "AST", "ALT", "ALKPHOS", "PROT", "ALBUMIN" in the last 8760 hours.  TUMOR MARKERS: No results for input(s): "AFPTM", "CEA", "CA199", "CHROMGRNA" in the last 8760 hours.  Assessment and Plan: Patient with past medical history of peripheral artery disease, smoker presents with complaint of left foot pain, peripheral artery disease.  IR consulted for angiogram with intervention at the request of Dr. Posey Pronto. Case reviewed by Dr. Earleen Newport who has met with the patient in consultation and who approves patient for procedure.  Patient presents today in their usual state of health.  She has been NPO and is not currently on blood thinners.   Risks and benefits were discussed with the patient including, but not limited to bleeding, infection, vascular injury or contrast induced renal failure, clot/plaque migration.  This interventional procedure involves the use of X-rays and because of the nature of the planned procedure, it is possible that we will have prolonged use of X-ray fluoroscopy.  Potential radiation risks to you include (but are not limited to) the following: - A slightly  elevated risk for cancer  several years later in life. This risk is typically less than 0.5% percent. This risk is low in comparison to the normal incidence of human cancer, which is 33% for women and 50% for men according to the [place]. - Radiation induced injury can include skin redness, resembling a rash, tissue breakdown / ulcers and hair loss (which can be temporary or permanent).   The likelihood of either of these occurring depends on the difficulty of the procedure and whether you are sensitive to radiation due to previous procedures, disease, or genetic conditions.   IF your procedure requires a prolonged use of radiation, you will be notified and given written instructions for further action.  It is your responsibility to monitor the irradiated area for the 2 weeks following the procedure and to notify your physician if you are concerned that you have suffered a radiation induced injury.    All of the patient's questions were answered, patient is agreeable to proceed.  Consent signed and in chart.  Thank you for this interesting consult.  I greatly enjoyed meeting Jenna Ryan and look forward to participating in their care.  A copy of this report was sent to the requesting provider on this date.  Electronically Signed: Docia Barrier, PA 11/26/2022, 9:30 AM   I spent a total of    25 Minutes in face to face in clinical consultation, greater than 50% of which was counseling/coordinating care for left foot pain, PAD.

## 2022-11-26 NOTE — Procedures (Addendum)
Interventional Radiology Procedure Note  Procedure:   US guided RCFA access US guided LCFA access CO2 angio of pelvis Pressure measurements.  Balloon mounted covered stenting of bilateral CIA ostium ('kissing stents'), 3mm x 69mm, bilateral.  PTA of right EIA stenosis, 38mm x 56mm Bilateral CELT for hemostasis.  EBL: ~50cc.  Complications: None  Recommendations:  - Right and left hips straight x 3 hours, observe for hematoma - Reverse trendelenburg OK during recovery - clear liquid diet during recovery - routine wound care - Do not submerge for 7 days - Patient will be on [facility] (home meds) and 81mg  ASA  - follow up with Dr. Earleen Newport in clinic in 4-6 weeks  Signed,  Dulcy Fanny. Earleen Newport, DO

## 2022-12-01 ENCOUNTER — Other Ambulatory Visit: Payer: Self-pay | Admitting: Interventional Radiology

## 2022-12-01 DIAGNOSIS — I739 Peripheral vascular disease, unspecified: Secondary | ICD-10-CM

## 2022-12-03 ENCOUNTER — Ambulatory Visit (HOSPITAL_COMMUNITY)
Admission: RE | Admit: 2022-12-03 | Discharge: 2022-12-03 | Disposition: A | Discharge status: Home / Self Care | Payer: Medicare HMO | Source: Ambulatory Visit | Attending: Interventional Radiology | Admitting: Interventional Radiology

## 2022-12-03 DIAGNOSIS — I739 Peripheral vascular disease, unspecified: Secondary | ICD-10-CM

## 2022-12-03 DIAGNOSIS — R222 Localized swelling, mass and lump, trunk: Secondary | ICD-10-CM | POA: Diagnosis not present

## 2022-12-03 NOTE — Progress Notes (Signed)
    Supervising Physician: Richarda Overlie  Patient Status:  [facility] Outpatient  Chief Complaint:  Small lump at groin/pubic area.  Brief History:  Jenna Ryan is an 82 yo lady who underwent bilateral CFA access for LE angiography and  treatment of aorta iliac disease with balloon mounted covered stenting (kissing stents) of the bilateral common iliac artery origin.by Dr. Loreta Ave back on 11/26/22.  She called today and reported "something coming out of her groin that feels rubbery".  Images and procedure note reviewed. She did have Celt arterial closure devices used bilaterally.   Allergies: Erythromycin, Macrolides and ketolides, Penicillins, and Sulfa antibiotics  Medications: Prior to Admission medications   Medication Sig Start Date End Date Taking? Authorizing Provider  acetaminophen (TYLENOL) 325 MG tablet Take 650 mg by mouth every 6 (six) hours as needed for moderate pain.    [provider]  apixaban (ELIQUIS) 5 MG TABS tablet Take 5 mg by mouth 1 day or 1 dose.    [provider]  aspirin EC 81 MG tablet Take 1 tablet (81 mg total) by mouth daily. Swallow whole. 11/26/22   Hoyt Koch, PA  HYDROcodone-acetaminophen (NORCO/VICODIN) 5-325 MG per tablet Take 2 tablets by mouth every 4 (four) hours as needed for pain. 04/25/13   Rancour, Jeannett Senior, MD  ibuprofen (ADVIL,MOTRIN) 200 MG tablet Take 400 mg by mouth every 6 (six) hours as needed for pain.    [provider]  levothyroxine (SYNTHROID, LEVOTHROID) 88 MCG tablet Take 88 mcg by mouth daily.    [provider]  rosuvastatin (CRESTOR) 5 MG tablet Take 5 mg by mouth daily.    [provider]  Vitamin D, Ergocalciferol, (DRISDOL) 50000 UNITS CAPS Take 50,000 Units by mouth.    [provider]     Vital Signs: There were no vitals taken for this visit.  Physical Exam Awake, Alert, Oriented NAD Right groin soft Left groin with moderate ecchymosis Limited US done at  bedside by Dr. Lowella Dandy showed no pseudoaneurysm Skin tag on mons pubis which was area of concern for patient.   Labs:  CBC: Recent Labs    11/26/22 0754  WBC 7.6  HGB 11.9*  HCT 36.3  PLT 157    COAGS: Recent Labs    11/26/22 0754  INR 1.0    BMP: Recent Labs    11/26/22 0754  NA 137  K 3.9  CL 103  CO2 26  GLUCOSE 100*  BUN 25*  CALCIUM 9.2  CREATININE 1.57*  GFRNONAA 32*    LIVER FUNCTION TESTS: No results for input(s): "BILITOT", "AST", "ALT", "ALKPHOS", "PROT", "ALBUMIN" in the last 8760 hours.  Assessment and Plan:  S/P bilateral CFA access with treatment of aorta iliac disease by Dr. Loreta Ave.  No Pseudoaneurysm seen on limited bedside doppler done by Dr. Lowella Dandy.  Patient states she will have skin tag removed at the dermatologist.  Electronically Signed: Gwynneth Macleod, PA-C 12/03/2022, 12:51 PM    I spent a total of 25 Minutes at the the patient's bedside AND on the patient's hospital floor or unit, greater than 50% of which was counseling/coordinating care for evaluation of groin complaint after angio.

## 2022-12-05 ENCOUNTER — Other Ambulatory Visit: Payer: Self-pay | Admitting: Interventional Radiology

## 2022-12-05 DIAGNOSIS — E785 Hyperlipidemia, unspecified: Secondary | ICD-10-CM | POA: Diagnosis not present

## 2022-12-05 DIAGNOSIS — I739 Peripheral vascular disease, unspecified: Secondary | ICD-10-CM

## 2022-12-05 DIAGNOSIS — E039 Hypothyroidism, unspecified: Secondary | ICD-10-CM | POA: Diagnosis not present

## 2022-12-05 DIAGNOSIS — K219 Gastro-esophageal reflux disease without esophagitis: Secondary | ICD-10-CM | POA: Diagnosis not present

## 2022-12-05 DIAGNOSIS — M810 Age-related osteoporosis without current pathological fracture: Secondary | ICD-10-CM | POA: Diagnosis not present

## 2022-12-05 DIAGNOSIS — R7302 Impaired glucose tolerance (oral): Secondary | ICD-10-CM | POA: Diagnosis not present

## 2022-12-05 DIAGNOSIS — R7989 Other specified abnormal findings of blood chemistry: Secondary | ICD-10-CM | POA: Diagnosis not present

## 2022-12-08 DIAGNOSIS — D649 Anemia, unspecified: Secondary | ICD-10-CM | POA: Diagnosis not present

## 2022-12-12 DIAGNOSIS — I129 Hypertensive chronic kidney disease with stage 1 through stage 4 chronic kidney disease, or unspecified chronic kidney disease: Secondary | ICD-10-CM | POA: Diagnosis not present

## 2022-12-12 DIAGNOSIS — J449 Chronic obstructive pulmonary disease, unspecified: Secondary | ICD-10-CM | POA: Diagnosis not present

## 2022-12-12 DIAGNOSIS — E785 Hyperlipidemia, unspecified: Secondary | ICD-10-CM | POA: Diagnosis not present

## 2022-12-12 DIAGNOSIS — N1832 Chronic kidney disease, stage 3b: Secondary | ICD-10-CM | POA: Diagnosis not present

## 2022-12-12 DIAGNOSIS — Z720 Tobacco use: Secondary | ICD-10-CM | POA: Diagnosis not present

## 2022-12-12 DIAGNOSIS — I872 Venous insufficiency (chronic) (peripheral): Secondary | ICD-10-CM | POA: Diagnosis not present

## 2022-12-12 DIAGNOSIS — Z1339 Encounter for screening examination for other mental health and behavioral disorders: Secondary | ICD-10-CM | POA: Diagnosis not present

## 2022-12-12 DIAGNOSIS — E039 Hypothyroidism, unspecified: Secondary | ICD-10-CM | POA: Diagnosis not present

## 2022-12-12 DIAGNOSIS — I739 Peripheral vascular disease, unspecified: Secondary | ICD-10-CM | POA: Diagnosis not present

## 2022-12-12 DIAGNOSIS — Z1331 Encounter for screening for depression: Secondary | ICD-10-CM | POA: Diagnosis not present

## 2022-12-12 DIAGNOSIS — Z0000 Encounter for general adult medical examination without abnormal findings: Secondary | ICD-10-CM | POA: Diagnosis not present

## 2022-12-12 DIAGNOSIS — R82998 Other abnormal findings in urine: Secondary | ICD-10-CM | POA: Diagnosis not present

## 2022-12-12 DIAGNOSIS — M810 Age-related osteoporosis without current pathological fracture: Secondary | ICD-10-CM | POA: Diagnosis not present

## 2023-01-07 ENCOUNTER — Ambulatory Visit
Admission: RE | Admit: 2023-01-07 | Discharge: 2023-01-07 | Disposition: A | Discharge status: Home / Self Care | Payer: Medicare HMO | Source: Ambulatory Visit | Attending: Interventional Radiology | Admitting: Interventional Radiology

## 2023-01-07 DIAGNOSIS — I739 Peripheral vascular disease, unspecified: Secondary | ICD-10-CM

## 2023-01-07 DIAGNOSIS — I779 Disorder of arteries and arterioles, unspecified: Secondary | ICD-10-CM | POA: Diagnosis not present

## 2023-01-07 DIAGNOSIS — I771 Stricture of artery: Secondary | ICD-10-CM | POA: Diagnosis not present

## 2023-01-07 HISTORY — PX: IR RADIOLOGIST EVAL & MGMT: IMG5224

## 2023-01-07 NOTE — Progress Notes (Signed)
Chief Complaint: Left great toe pain   Referring Physician(s): Patel,Kevin P   PCP: Dr. Felipa Eth, [facility]   History of Present Illness: Jenna Ryan is a 82 y.o. female presenting today to [facility] clinic as scheduled follow up SP treatment of bilateral inflow disease.    Ms Jorde joins [place] today by herself.   History:  We met Ms Feria 10/31/22. At the time she was describing increasing pain in her left toe and forefoot, with associated pain that extends along the plantar aspect of the left foot towards the heel.  Also describes some "numbness" of the forefoot involving the first through fourth toes.       She was  ambulating far enough for claudication.     She denies history of MI or stroke.  She does not have a history of CHF.  She was treated more than 10 years ago for a superficial skin cancer of the left lower extremity.     She is a widow, as her husband passed about 3 years ago.  She had 1 son who passed a few years ago at the age of 66 (she states adopted).  She lives by herself.  She has 1 brother and 1 sister.  Her sister, [name], is her POA.  She also has a niece from her husband's family who is a Engineer, civil (consulting) and is involved with her health care decision making.    Cardiovascular risk factors include: Smoking (1ppd since 82yo), HTN,   History of DVT September 2023.  She remains on eliquis now.    Non-invasive exam:  Right ABI: 0.51 Right TBI:0.22 Left ABI: 0.31 Left TBI: unable to insonate   Interval History Ms Crislip was treated 11/26/22 for bilateral inflow disease/iliac disease with bilateral CFA access, kissing stenting of the bilateral CIA stenosis, and PTA of right EIA stenosis.  She was discharged home the same day.   She came back for evaluation 12/03/22 because of a concern for a "lump" in the left inguinal region, which was determined to be a skin tag.  Duplex for pseudoaneurysm was negative.   Today she tells me that she has no  significant pain in the feet, with no further complain of the left foot.  She reports no change in the "tingling sensation" of the toes.    On exam her foot rubor is resolved.   She denies any new symptoms.   New non-invasive exam shows improved ABI/TBI bilateral: Right ABI: 0.72 Right TBI: 0.39, SBP of 54 Left ABI: 0.55 Left TBI: 0.38, SBP of 52  Past Medical History:  Diagnosis Date   Cancer (HCC)    skin    Past Surgical History:  Procedure Laterality Date   CHOLECYSTECTOMY     IR ANGIOGRAM EXTREMITY BILATERAL  11/26/2022   IR ANGIOGRAM PELVIS SELECTIVE OR SUPRASELECTIVE  11/26/2022   IR RADIOLOGIST EVAL & MGMT  10/31/2022   IR RADIOLOGIST EVAL & MGMT  11/13/2022   IR TRANSCATH PLC STENT 1ST ART NOT LE CV CAR VERT CAR  11/26/2022   IR US GUIDE VASC ACCESS LEFT  11/26/2022   IR US GUIDE VASC ACCESS RIGHT  11/26/2022    Allergies: Erythromycin, Macrolides and ketolides, Penicillins, and Sulfa antibiotics  Medications: Prior to Admission medications   Medication Sig Start Date End Date Taking? Authorizing Provider  acetaminophen (TYLENOL) 325 MG tablet Take 650 mg by mouth every 6 (six) hours as needed for moderate pain.    [provider]  apixaban (ELIQUIS) 5 MG TABS tablet Take 5 mg by mouth 1 day or 1 dose.    [provider]  aspirin EC 81 MG tablet Take 1 tablet (81 mg total) by mouth daily. Swallow whole. 11/26/22   Hoyt Koch, PA  HYDROcodone-acetaminophen (NORCO/VICODIN) 5-325 MG per tablet Take 2 tablets by mouth every 4 (four) hours as needed for pain. 04/25/13   Rancour, Jeannett Senior, MD  ibuprofen (ADVIL,MOTRIN) 200 MG tablet Take 400 mg by mouth every 6 (six) hours as needed for pain.    [provider]  levothyroxine (SYNTHROID, LEVOTHROID) 88 MCG tablet Take 88 mcg by mouth daily.    [provider]  rosuvastatin (CRESTOR) 5 MG tablet Take 5 mg by mouth daily.    [provider]  Vitamin D, Ergocalciferol, (DRISDOL)  50000 UNITS CAPS Take 50,000 Units by mouth.    [provider]     Family History  Problem Relation Age of Onset   Diabetes Mother    Alzheimer's disease Father     Social History   Socioeconomic History   Marital status: Married    Spouse name: Not on file   Number of children: Not on file   Years of education: Not on file   Highest education level: Not on file  Occupational History   Not on file  Tobacco Use   Smoking status: Every Day   Smokeless tobacco: Not on file  Substance and Sexual Activity   Alcohol use: No   Drug use: No   Sexual activity: Not on file  Other Topics Concern   Not on file  Social History Narrative   Not on file   Social Determinants of Health   Financial Resource Strain: Not on file  Food Insecurity: Not on file  Transportation Needs: Not on file  Physical Activity: Not on file  Stress: Not on file  Social Connections: Not on file       Review of Systems: A 12 point ROS discussed and pertinent positives are indicated in the HPI above.  All other systems are negative.  Review of Systems  Vital Signs: BP 133/66 (BP Location: Right Arm, Patient Position: Sitting, Cuff Size: Normal)   Pulse 63   Temp 98.4 F (36.9 C) (Oral)   Wt 81.6 kg   SpO2 98% Comment: room air  BMI 29.95 kg/m   Advance Care Plan: The advanced care plan/surrogate decision maker was discussed at the time of visit and documented in the medical record.    Physical Exam General: 82 yo female appearing stated age.  Well-developed, well-nourished.  No distress. HEENT: Atraumatic, normocephalic.  Conjugate gaze, extra-ocular motor intact. No scleral icterus or scleral injection. No lesions on external ears, nose, lips, or gums.  Oral mucosa moist, pink.  Neck: Symmetric with no goiter enlargement.  Chest/Lungs:  Symmetric chest with inspiration/expiration.  No labored breathing.    Heart:   No JVD appreciated.  Abdomen:  obese, NT/ND.   Genito-urinary:  Deferred Neurologic: Alert & Oriented to person, place, and time.   Normal affect and insight.  Appropriate questions.  Moving all 4 extremities Pulse Exam:  Palpable right CFA & left CFA.  Complete healing of the bilateral access sites.  No palpable lumps/bumps.  Palpable right AT & PT.  Palpable left AT & PT. Resolution of the prior bilateral forefoot rubor. No wounds Extremities: No wound.  Resolution of the prior bilateral forefoot rubor.     Mallampati Score:  Imaging: No results found.  Labs:  CBC: Recent Labs    11/26/22 0754  WBC 7.6  HGB 11.9*  HCT 36.3  PLT 157    COAGS: Recent Labs    11/26/22 0754  INR 1.0    BMP: Recent Labs    11/26/22 0754  NA 137  K 3.9  CL 103  CO2 26  GLUCOSE 100*  BUN 25*  CALCIUM 9.2  CREATININE 1.57*  GFRNONAA 32*    LIVER FUNCTION TESTS: No results for input(s): "BILITOT", "AST", "ALT", "ALKPHOS", "PROT", "ALBUMIN" in the last 8760 hours.  TUMOR MARKERS: No results for input(s): "AFPTM", "CEA", "CA199", "CHROMGRNA" in the last 8760 hours.  Assessment and Plan:   Assessment:  Ms Karpinski is a 82yo female with bilateral PAD, left resting pain, SP treatment of bilateral iliac/inflow disease with kissing stents 11/26/22.   She has had resolution of pain on the left, and resolution of the bilateral forefoot rubor.    She now has palpable PT and DP pulses.   New non-invasive exam confirms improved ABI bilateral: Right ABI: 0.72 Right TBI: 0.39, SBP of 54 Left ABI: 0.55 Left TBI: 0.38, SBP of 52   Previous: right ABI 0.51 in the moderate range severity, and the left 0.31, severe range severity.      I discussed these results with her, as well as the complete segmental exam which shows that she has persisting fem-pop distribution of disease.  I do think reasonable to proceed with the planned podiatric treatment of the left foot, as there has been improved ABI, current palpable pulse, and pressure now measuring >50 SBP  in the left great toe.    She understands.  If for some reason she has any trouble healing the site, we are happy to discuss treatment of the left fem-pop disease.     Finally I also discussed smoking cessation with her today.  Clearly, she is not ready to consider this.    Plan: - I do think reasonable to proceed with the planned podiatric treatment of the left foot, as there has been improved ABI, current palpable pulse, and pressure now measuring >50 SBP in the left great toe.  - We will set up follow up visit for 12 months from now.  We are happy to see her back in the interval with any concerns.  - I discussed smoking cessation again with her today.  - Continue max medical therapy  Electronically Signed: Gilmer Mor 01/07/2023, 11:44 AM   I spent a total of    40 Minutes in face to face in clinical consultation, greater than 50% of which was counseling/coordinating care for PAD/CLI, SP bilateral iliac artery treatment.

## 2023-01-28 DIAGNOSIS — I739 Peripheral vascular disease, unspecified: Secondary | ICD-10-CM | POA: Diagnosis not present

## 2023-01-28 DIAGNOSIS — I82432 Acute embolism and thrombosis of left popliteal vein: Secondary | ICD-10-CM | POA: Diagnosis not present

## 2023-01-28 DIAGNOSIS — K219 Gastro-esophageal reflux disease without esophagitis: Secondary | ICD-10-CM | POA: Diagnosis not present

## 2023-01-28 DIAGNOSIS — R5383 Other fatigue: Secondary | ICD-10-CM | POA: Diagnosis not present

## 2023-01-28 DIAGNOSIS — F17200 Nicotine dependence, unspecified, uncomplicated: Secondary | ICD-10-CM | POA: Diagnosis not present

## 2023-01-28 DIAGNOSIS — D649 Anemia, unspecified: Secondary | ICD-10-CM | POA: Diagnosis not present

## 2023-02-02 DIAGNOSIS — D649 Anemia, unspecified: Secondary | ICD-10-CM | POA: Diagnosis not present

## 2023-03-09 DIAGNOSIS — F17200 Nicotine dependence, unspecified, uncomplicated: Secondary | ICD-10-CM | POA: Diagnosis not present

## 2023-03-09 DIAGNOSIS — D649 Anemia, unspecified: Secondary | ICD-10-CM | POA: Diagnosis not present

## 2023-03-09 DIAGNOSIS — I82432 Acute embolism and thrombosis of left popliteal vein: Secondary | ICD-10-CM | POA: Diagnosis not present

## 2023-03-09 DIAGNOSIS — R195 Other fecal abnormalities: Secondary | ICD-10-CM | POA: Diagnosis not present

## 2023-03-09 DIAGNOSIS — R5383 Other fatigue: Secondary | ICD-10-CM | POA: Diagnosis not present

## 2023-03-09 DIAGNOSIS — K219 Gastro-esophageal reflux disease without esophagitis: Secondary | ICD-10-CM | POA: Diagnosis not present

## 2023-03-09 DIAGNOSIS — I739 Peripheral vascular disease, unspecified: Secondary | ICD-10-CM | POA: Diagnosis not present

## 2023-03-11 ENCOUNTER — Ambulatory Visit: Payer: Medicare HMO | Admitting: Podiatry

## 2023-03-11 ENCOUNTER — Telehealth: Payer: Self-pay | Admitting: Podiatry

## 2023-03-11 DIAGNOSIS — Z01818 Encounter for other preprocedural examination: Secondary | ICD-10-CM

## 2023-03-11 DIAGNOSIS — L603 Nail dystrophy: Secondary | ICD-10-CM | POA: Diagnosis not present

## 2023-03-11 NOTE — Progress Notes (Signed)
Subjective:  Patient ID: SHAMESHA SUH, female    DOB: 02-Oct-1940,  MRN: [id number]  Chief Complaint  Patient presents with   Nail Problem    82 y.o. female presents with the above complaint.  Patient presents with left hallux second and third digit thickened elongated dystrophic mycotic nail.  She would like to have it removed temporarily she has not seen anyone as prior to seeing me she is on Eliquis.  She denies any other acute complaints.  She has tried all conservative care but unable to tolerate the shoes.  She does not want to do injection while awake.  She would like to have her nails removed in the surgical center   Review of Systems: Negative except as noted in the HPI. Denies N/V/F/Ch.  Past Medical History:  Diagnosis Date   Cancer (HCC)    skin    Current Outpatient Medications:    acetaminophen (TYLENOL) 325 MG tablet, Take 650 mg by mouth every 6 (six) hours as needed for moderate pain., Disp: , Rfl:    apixaban (ELIQUIS) 5 MG TABS tablet, Take 5 mg by mouth 1 day or 1 dose., Disp: , Rfl:    aspirin EC 81 MG tablet, Take 1 tablet (81 mg total) by mouth daily. Swallow whole., Disp: 30 tablet, Rfl: 12   HYDROcodone-acetaminophen (NORCO/VICODIN) 5-325 MG per tablet, Take 2 tablets by mouth every 4 (four) hours as needed for pain., Disp: 20 tablet, Rfl: 0   ibuprofen (ADVIL,MOTRIN) 200 MG tablet, Take 400 mg by mouth every 6 (six) hours as needed for pain., Disp: , Rfl:    levothyroxine (SYNTHROID, LEVOTHROID) 88 MCG tablet, Take 88 mcg by mouth daily., Disp: , Rfl:    rosuvastatin (CRESTOR) 5 MG tablet, Take 5 mg by mouth daily., Disp: , Rfl:    Vitamin D, Ergocalciferol, (DRISDOL) 50000 UNITS CAPS, Take 50,000 Units by mouth., Disp: , Rfl:   Social History   Tobacco Use  Smoking Status Every Day  Smokeless Tobacco Not on file    Allergies  Allergen Reactions   Erythromycin Swelling and Other (See Comments)    Mouth sores    Macrolides And Ketolides Itching and  Rash   Penicillins Itching and Rash   Sulfa Antibiotics Itching and Rash   Objective:  There were no vitals filed for this visit. There is no height or weight on file to calculate BMI. Constitutional Well developed. Well nourished.  Vascular Dorsalis pedis pulses palpable bilaterally. Posterior tibial pulses palpable bilaterally. Capillary refill normal to all digits.  No cyanosis or clubbing noted. Pedal hair growth normal.  Neurologic Normal speech. Oriented to person, place, and time. Epicritic sensation to light touch grossly present bilaterally.  Dermatologic Pain on palpation of the entire/total nail on 1st, 2nd, and 3rd digit of the left No other open wounds. No skin lesions.  Orthopedic: Normal joint ROM without pain or crepitus bilaterally. No visible deformities. No bony tenderness.   Radiographs: None Assessment:   1. Nail dystrophy   2. Encounter for preoperative examination for general surgical procedure    Plan:  Patient was evaluated and treated and all questions answered.  Nail contusion/dystrophy hallux, second, third, left -Patient elects to proceed with minor surgery to remove entire toenail today. Consent reviewed and signed by patient. -Entire/total nail excised. See procedure note. -Educated on post-procedure care including soaking. Written instructions provided and reviewed. -Patient to follow up in 2 weeks for nail check.  Procedure: Excision of entire/total nail  Location: Left  1st toe, 2nd toe, and 3rd toe digit Anesthesia: Lidocaine 1% plain; 1.5 mL and Marcaine 0.5% plain; 1.5 mL, digital block. Skin Prep: Betadine. Dressing: Silvadene; telfa; dry, sterile, compression dressing. Technique: Following skin prep, the toe was exsanguinated and a tourniquet was secured at the base of the toe. The affected nail border was freed and excised. The tourniquet was then removed and sterile dressing applied. Disposition: Patient tolerated procedure well.  Patient to return in 2 weeks for follow-up.   No follow-ups on file.   Left hallux second and third digit temporary removal under anesthesia.  Surgery scheduled at the surgery center

## 2023-03-11 NOTE — Telephone Encounter (Signed)
DOS 03/23/23  EXCISION OF TOENAIL 1ST-3RD LT - 11750  [employer] MEDICARE EFFECTIVE DATE - 08/25/2020  DED $0 OOP $4500 W/ $3855 REMAINING COINS 0%  SPOKE WITH JM J AT [employer]. JM CONFIRMED THAT PROCEDURE CODE 69629 DOES NOT REQUIRE PRIOR AUTHORIZATION.  REFERENCE NUMBER [id number]

## 2023-03-17 ENCOUNTER — Telehealth: Payer: Self-pay | Admitting: Podiatry

## 2023-03-17 NOTE — Telephone Encounter (Signed)
DOS 03/23/23  EXCISION NAIL PERM 1ST-3RD LT - 11750  [employer] MEDICARE EFFECTIVE DATE - 08/25/2020  OOP $4500 W/ $3855 REMAINING   PER JEZ B AT [employer] CPT CODE 16109 DOES NOT REQUIRE PRIOR AUTHORIZATION  REFERENCE  # [id number]

## 2023-03-17 NOTE — Telephone Encounter (Signed)
Patient called wanting to know when to stop her blood thinner prior to surgery with you.  -Jenna Ryan

## 2023-03-23 ENCOUNTER — Other Ambulatory Visit: Payer: Self-pay | Admitting: Podiatry

## 2023-03-23 ENCOUNTER — Telehealth: Payer: Self-pay | Admitting: Podiatry

## 2023-03-23 DIAGNOSIS — L600 Ingrowing nail: Secondary | ICD-10-CM

## 2023-03-23 MED ORDER — OXYCODONE-ACETAMINOPHEN 5-325 MG PO TABS: 1.0000 | ORAL_TABLET | ORAL | 0 refills | Status: AC | PRN

## 2023-03-23 MED ORDER — IBUPROFEN 800 MG PO TABS: 800.0000 mg | ORAL_TABLET | Freq: Four times a day (QID) | ORAL | 1 refills | Status: AC | PRN

## 2023-03-23 NOTE — Telephone Encounter (Signed)
Niece of patient, Kirke Shaggy, is requesting call back regarding post-procedure instructions. LVM on patient's answering machine to let her know she should receive a call back regarding instructions on tomorrow.

## 2023-03-31 ENCOUNTER — Encounter: Payer: Self-pay | Admitting: Podiatry

## 2023-03-31 ENCOUNTER — Ambulatory Visit (INDEPENDENT_AMBULATORY_CARE_PROVIDER_SITE_OTHER): Payer: Medicare HMO | Admitting: Podiatry

## 2023-03-31 DIAGNOSIS — L603 Nail dystrophy: Secondary | ICD-10-CM

## 2023-03-31 DIAGNOSIS — Z860100 Personal history of colon polyps, unspecified: Secondary | ICD-10-CM | POA: Insufficient documentation

## 2023-03-31 DIAGNOSIS — M8000XA Age-related osteoporosis with current pathological fracture, unspecified site, initial encounter for fracture: Secondary | ICD-10-CM | POA: Insufficient documentation

## 2023-03-31 DIAGNOSIS — I82432 Acute embolism and thrombosis of left popliteal vein: Secondary | ICD-10-CM | POA: Insufficient documentation

## 2023-03-31 DIAGNOSIS — R600 Localized edema: Secondary | ICD-10-CM | POA: Insufficient documentation

## 2023-03-31 DIAGNOSIS — Z720 Tobacco use: Secondary | ICD-10-CM | POA: Insufficient documentation

## 2023-03-31 DIAGNOSIS — Z9889 Other specified postprocedural states: Secondary | ICD-10-CM

## 2023-03-31 DIAGNOSIS — S22080A Wedge compression fracture of T11-T12 vertebra, initial encounter for closed fracture: Secondary | ICD-10-CM | POA: Insufficient documentation

## 2023-03-31 DIAGNOSIS — N644 Mastodynia: Secondary | ICD-10-CM | POA: Insufficient documentation

## 2023-03-31 DIAGNOSIS — R159 Full incontinence of feces: Secondary | ICD-10-CM | POA: Insufficient documentation

## 2023-03-31 NOTE — Progress Notes (Signed)
She presents today as a postop patient of Dr. Eliane Decree with a chief concern of toenail avulsion hallux second toe and third toe of the left foot.  States has been burning a lot and is red and swollen and soaking in Epsom salts.  Objective: Vital signs are stable oriented x 3.  Toes were dressed with a light dressing they do demonstrate erythema no purulence no malodor.  There is some maceration most likely associated with the antibiotic that she has been using she was using Polysporin but she is allergic to Neosporin so I recommended that she discontinue the use of the Polysporin.  Assessment: Role painful toes after total nail avulsions temporary hallux second digit third digit left foot.  Plan: Redressed today encouraged to continue soaking possibly Betadine solution.  She will follow-up with Allena Katz next week

## 2023-04-01 ENCOUNTER — Encounter: Payer: Medicare HMO | Admitting: Podiatry

## 2023-04-10 ENCOUNTER — Ambulatory Visit (INDEPENDENT_AMBULATORY_CARE_PROVIDER_SITE_OTHER): Payer: Medicare HMO | Admitting: Podiatry

## 2023-04-10 DIAGNOSIS — L97521 Non-pressure chronic ulcer of other part of left foot limited to breakdown of skin: Secondary | ICD-10-CM

## 2023-04-10 NOTE — Progress Notes (Signed)
Subjective:  Patient ID: Jenna Ryan, female    DOB: [birth year],  MRN: [id number]  Chief Complaint  Patient presents with   Nail Problem    82 y.o. female presents with the above complaint.  Patient presents with left hallux wound with a history of total nail avulsion done by me.  He is she states that that has not healed.  She states that all the rest of them has healed really well.  Denies any other acute complaints has been keeping it wet has not been doing any dressings.  Pain scale is 5 out of 10 dull aching nature   Review of Systems: Negative except as noted in the HPI. Denies N/V/F/Ch.  Past Medical History:  Diagnosis Date   Cancer (HCC)    skin    Current Outpatient Medications:    acetaminophen (TYLENOL) 325 MG tablet, Take 650 mg by mouth every 6 (six) hours as needed for moderate pain., Disp: , Rfl:    apixaban (ELIQUIS) 5 MG TABS tablet, Take 5 mg by mouth 1 day or 1 dose., Disp: , Rfl:    aspirin EC 81 MG tablet, Take 1 tablet (81 mg total) by mouth daily. Swallow whole., Disp: 30 tablet, Rfl: 12   clopidogrel (PLAVIX) 75 MG tablet, , Disp: , Rfl:    HYDROcodone-acetaminophen (NORCO/VICODIN) 5-325 MG per tablet, Take 2 tablets by mouth every 4 (four) hours as needed for pain., Disp: 20 tablet, Rfl: 0   ibuprofen (ADVIL) 800 MG tablet, Take 1 tablet (800 mg total) by mouth every 6 (six) hours as needed., Disp: 60 tablet, Rfl: 1   ibuprofen (ADVIL,MOTRIN) 200 MG tablet, Take 400 mg by mouth every 6 (six) hours as needed for pain., Disp: , Rfl:    levothyroxine (SYNTHROID, LEVOTHROID) 88 MCG tablet, Take 88 mcg by mouth daily., Disp: , Rfl:    meclizine (ANTIVERT) 25 MG tablet, TAKE 1 TABLET BY MOUTH EVERY 6 HOURS AS NEEDED FOR DIZZINESS, Disp: , Rfl:    olmesartan-hydrochlorothiazide (BENICAR HCT) 40-12.5 MG tablet, Take 1 tablet by mouth daily., Disp: , Rfl:    oxyCODONE-acetaminophen (PERCOCET) 5-325 MG tablet, Take 1 tablet by mouth every 4 (four) hours as needed for  severe pain., Disp: 30 tablet, Rfl: 0   pantoprazole (PROTONIX) 40 MG tablet, Take 40 mg by mouth daily., Disp: , Rfl:    rosuvastatin (CRESTOR) 5 MG tablet, Take 5 mg by mouth daily., Disp: , Rfl:    Vitamin D, Ergocalciferol, (DRISDOL) 50000 UNITS CAPS, Take 50,000 Units by mouth., Disp: , Rfl:   Social History   Tobacco Use  Smoking Status Every Day  Smokeless Tobacco Not on file    Allergies  Allergen Reactions   Aspirin     Other Reaction(s): GI upset   Atorvastatin Swelling   Doxycycline     Other Reaction(s): nausea and vomiting   Erythromycin Swelling and Other (See Comments)    Mouth sores    Macrolides And Ketolides Itching and Rash   Penicillins Itching and Rash   Sulfa Antibiotics Itching and Rash   Objective:  There were no vitals filed for this visit. There is no height or weight on file to calculate BMI. Constitutional Well developed. Well nourished.  Vascular Dorsalis pedis pulses palpable bilaterally. Posterior tibial pulses palpable bilaterally. Capillary refill normal to all digits.  No cyanosis or clubbing noted. Pedal hair growth normal.  Neurologic Normal speech. Oriented to person, place, and time. Epicritic sensation to light touch grossly present bilaterally.  Dermatologic Left hallux wound limited to the breakdown of the skin.  No probing to bone noted no malodor present no erythema noted.  Orthopedic: Normal joint ROM without pain or crepitus bilaterally. No visible deformities. No bony tenderness.   Radiographs: None Assessment:   1. Chronic toe ulcer, left, limited to breakdown of skin [facility])    Plan:  Patient was evaluated and treated and all questions answered.  Left hallux wound slow to heal limited to the breakdown of the skin -All questions and concerns were discussed with the patient extensive detail -I discussed with the patient she will benefit from Betadine wet-to-dry dressing which was shown to the patient she states  understand will do that once a day. -I encouraged her that this will take a little bit longer to heal.  The rest of her toes has healed without any acute issues  No follow-ups on file.  Left hallux wound did not heal with a history of nail avulsion done by me.  Betadine wet-to-dry dressing not infected

## 2023-04-14 ENCOUNTER — Other Ambulatory Visit: Payer: Self-pay | Admitting: Interventional Radiology

## 2023-04-14 DIAGNOSIS — I739 Peripheral vascular disease, unspecified: Secondary | ICD-10-CM

## 2023-04-15 ENCOUNTER — Encounter: Payer: Medicare HMO | Admitting: Podiatry

## 2023-04-22 DIAGNOSIS — D485 Neoplasm of uncertain behavior of skin: Secondary | ICD-10-CM | POA: Diagnosis not present

## 2023-04-22 DIAGNOSIS — D0461 Carcinoma in situ of skin of right upper limb, including shoulder: Secondary | ICD-10-CM | POA: Diagnosis not present

## 2023-05-04 ENCOUNTER — Other Ambulatory Visit: Payer: Medicare HMO

## 2023-05-08 ENCOUNTER — Ambulatory Visit (INDEPENDENT_AMBULATORY_CARE_PROVIDER_SITE_OTHER): Payer: Medicare HMO | Admitting: Podiatry

## 2023-05-08 DIAGNOSIS — L97521 Non-pressure chronic ulcer of other part of left foot limited to breakdown of skin: Secondary | ICD-10-CM | POA: Diagnosis not present

## 2023-05-08 NOTE — Progress Notes (Signed)
Subjective:  Patient ID: Jenna Ryan, female    DOB: 02-08-1941,  MRN: [id number]  Chief Complaint  Patient presents with   Routine Post Op    82 y.o. female presents with the above complaint.  Patient presents with left hallux wound with a history of total nail avulsion done by me.  He is she states that that has not healed.  She states that all the rest of them has healed really well.  Denies any other acute complaints has been keeping it wet has not been doing any dressings.  Pain scale is 5 out of 10 dull aching nature   Review of Systems: Negative except as noted in the HPI. Denies N/V/F/Ch.  Past Medical History:  Diagnosis Date   Cancer (HCC)    skin    Current Outpatient Medications:    acetaminophen (TYLENOL) 325 MG tablet, Take 650 mg by mouth every 6 (six) hours as needed for moderate pain., Disp: , Rfl:    apixaban (ELIQUIS) 5 MG TABS tablet, Take 5 mg by mouth 1 day or 1 dose., Disp: , Rfl:    aspirin EC 81 MG tablet, Take 1 tablet (81 mg total) by mouth daily. Swallow whole., Disp: 30 tablet, Rfl: 12   clopidogrel (PLAVIX) 75 MG tablet, , Disp: , Rfl:    HYDROcodone-acetaminophen (NORCO/VICODIN) 5-325 MG per tablet, Take 2 tablets by mouth every 4 (four) hours as needed for pain., Disp: 20 tablet, Rfl: 0   ibuprofen (ADVIL) 800 MG tablet, Take 1 tablet (800 mg total) by mouth every 6 (six) hours as needed., Disp: 60 tablet, Rfl: 1   ibuprofen (ADVIL,MOTRIN) 200 MG tablet, Take 400 mg by mouth every 6 (six) hours as needed for pain., Disp: , Rfl:    levothyroxine (SYNTHROID, LEVOTHROID) 88 MCG tablet, Take 88 mcg by mouth daily., Disp: , Rfl:    meclizine (ANTIVERT) 25 MG tablet, TAKE 1 TABLET BY MOUTH EVERY 6 HOURS AS NEEDED FOR DIZZINESS, Disp: , Rfl:    olmesartan-hydrochlorothiazide (BENICAR HCT) 40-12.5 MG tablet, Take 1 tablet by mouth daily., Disp: , Rfl:    oxyCODONE-acetaminophen (PERCOCET) 5-325 MG tablet, Take 1 tablet by mouth every 4 (four) hours as needed  for severe pain., Disp: 30 tablet, Rfl: 0   pantoprazole (PROTONIX) 40 MG tablet, Take 40 mg by mouth daily., Disp: , Rfl:    rosuvastatin (CRESTOR) 5 MG tablet, Take 5 mg by mouth daily., Disp: , Rfl:    Vitamin D, Ergocalciferol, (DRISDOL) 50000 UNITS CAPS, Take 50,000 Units by mouth., Disp: , Rfl:   Social History   Tobacco Use  Smoking Status Every Day  Smokeless Tobacco Not on file    Allergies  Allergen Reactions   Aspirin     Other Reaction(s): GI upset   Atorvastatin Swelling   Doxycycline     Other Reaction(s): nausea and vomiting   Erythromycin Swelling and Other (See Comments)    Mouth sores    Macrolides And Ketolides Itching and Rash   Penicillins Itching and Rash   Sulfa Antibiotics Itching and Rash   Objective:  There were no vitals filed for this visit. There is no height or weight on file to calculate BMI. Constitutional Well developed. Well nourished.  Vascular Dorsalis pedis pulses palpable bilaterally. Posterior tibial pulses palpable bilaterally. Capillary refill normal to all digits.  No cyanosis or clubbing noted. Pedal hair growth normal.  Neurologic Normal speech. Oriented to person, place, and time. Epicritic sensation to light touch grossly present bilaterally.  Dermatologic Left hallux wound completely with a lysed.  No further signs of wound noted.  Denies no signs of infection noted.  Orthopedic: Normal joint ROM without pain or crepitus bilaterally. No visible deformities. No bony tenderness.   Radiographs: None Assessment:   No diagnosis found.  Plan:  Patient was evaluated and treated and all questions answered.  Left hallux wound slow to heal limited to the breakdown of the skin -All questions and concerns were discussed with the patient extensive detail -Clinically healed and officially discharged from my care if any foot and ankle issues on future she will come back and see me.

## 2023-05-13 DIAGNOSIS — Z23 Encounter for immunization: Secondary | ICD-10-CM | POA: Diagnosis not present

## 2023-05-13 DIAGNOSIS — J449 Chronic obstructive pulmonary disease, unspecified: Secondary | ICD-10-CM | POA: Diagnosis not present

## 2023-05-13 DIAGNOSIS — I739 Peripheral vascular disease, unspecified: Secondary | ICD-10-CM | POA: Diagnosis not present

## 2023-05-13 DIAGNOSIS — K219 Gastro-esophageal reflux disease without esophagitis: Secondary | ICD-10-CM | POA: Diagnosis not present

## 2023-05-13 DIAGNOSIS — N1832 Chronic kidney disease, stage 3b: Secondary | ICD-10-CM | POA: Diagnosis not present

## 2023-05-13 DIAGNOSIS — M810 Age-related osteoporosis without current pathological fracture: Secondary | ICD-10-CM | POA: Diagnosis not present

## 2023-05-13 DIAGNOSIS — I82432 Acute embolism and thrombosis of left popliteal vein: Secondary | ICD-10-CM | POA: Diagnosis not present

## 2023-05-13 DIAGNOSIS — M533 Sacrococcygeal disorders, not elsewhere classified: Secondary | ICD-10-CM | POA: Diagnosis not present

## 2023-05-13 DIAGNOSIS — D649 Anemia, unspecified: Secondary | ICD-10-CM | POA: Diagnosis not present

## 2023-05-13 DIAGNOSIS — I129 Hypertensive chronic kidney disease with stage 1 through stage 4 chronic kidney disease, or unspecified chronic kidney disease: Secondary | ICD-10-CM | POA: Diagnosis not present

## 2023-05-13 DIAGNOSIS — F17200 Nicotine dependence, unspecified, uncomplicated: Secondary | ICD-10-CM | POA: Diagnosis not present

## 2023-05-13 DIAGNOSIS — E785 Hyperlipidemia, unspecified: Secondary | ICD-10-CM | POA: Diagnosis not present

## 2023-05-27 DIAGNOSIS — M898X1 Other specified disorders of bone, shoulder: Secondary | ICD-10-CM | POA: Diagnosis not present

## 2023-05-27 DIAGNOSIS — Z9181 History of falling: Secondary | ICD-10-CM | POA: Diagnosis not present

## 2023-05-27 DIAGNOSIS — J449 Chronic obstructive pulmonary disease, unspecified: Secondary | ICD-10-CM | POA: Diagnosis not present

## 2023-05-27 DIAGNOSIS — M810 Age-related osteoporosis without current pathological fracture: Secondary | ICD-10-CM | POA: Diagnosis not present

## 2023-05-27 DIAGNOSIS — R0781 Pleurodynia: Secondary | ICD-10-CM | POA: Diagnosis not present

## 2023-05-29 ENCOUNTER — Ambulatory Visit
Admission: RE | Admit: 2023-05-29 | Discharge: 2023-05-29 | Disposition: A | Discharge status: Home / Self Care | Payer: Medicare HMO | Source: Ambulatory Visit | Attending: Interventional Radiology | Admitting: Interventional Radiology

## 2023-05-29 DIAGNOSIS — I739 Peripheral vascular disease, unspecified: Secondary | ICD-10-CM

## 2023-05-29 HISTORY — PX: IR RADIOLOGIST EVAL & MGMT: IMG5224

## 2023-05-29 NOTE — Progress Notes (Signed)
Chief Complaint: PAD, treated for inflow disease with kissing stents   Referring Physician(s): Patel,Kevin P   PCP: Dr. Felipa Eth, [facility]   History of Present Illness: Jenna Ryan is a 82 y.o. female presenting today to VIR clinic as scheduled follow up SP treatment of bilateral inflow disease.    Jenna Ryan joins [place] today with her sister   History:  We met Jenna Ryan 10/31/22. At the time she was describing increasing pain in her left toe and forefoot, with associated pain that extends along the plantar aspect of the left foot towards the heel.  Also describes some "numbness" of the forefoot involving the first through fourth toes.       She was  ambulating far enough for claudication.     She denies history of MI or stroke.  She does not have a history of CHF.  She was treated more than 10 years ago for a superficial skin cancer of the left lower extremity.     She is a widow, as her husband passed about 3 years ago.  She had 1 son who passed a few years ago at the age of 28 (she states adopted).  She lives by herself.  She has 1 brother and 1 sister.  Her sister, Jenna Ryan, is her POA.  She also has a niece from her husband's family who is a Engineer, civil (consulting) and is involved with her health care decision making.    Cardiovascular risk factors include: Smoking (1ppd since 82yo), HTN,    History of DVT September 2023.  She remains on eliquis now.    Non-invasive exam:  Right ABI: 0.51 Right TBI:0.22 Left ABI: 0.31 Left TBI: unable to insonate   Non-invasive exam 01/07/23 Right ABI: 0.72 Right TBI: 0.39, SBP of 54 Left ABI: 0.55 Left TBI: 0.38, SBP of 52  Interval History Today she tells me that she "just wanted to show me her toe."  Since we treated her bilateral iliac disease, she has undergone left nail surgery.  This has healed completely.  There is no redness/erythema, drainage, or discoloration.  She denies pain. She says it "looks ugly", but otherwise no  complaint.     No new complaint.  She is ambulating comfortably.    I reviewed previous operative notes, imaging, angiograms, non-invasive exams, progress notes, and lab tests.  [id number])  She continues to smoke.  She is taking 81mg  ASA daily, as well as BID 2.5mg  eliquis.  She tells me she cannot tolerate any cholesterol medication.      Past Medical History:  Diagnosis Date   Cancer [facility])    skin    Past Surgical History:  Procedure Laterality Date   CHOLECYSTECTOMY     IR ANGIOGRAM EXTREMITY BILATERAL  11/26/2022   IR ANGIOGRAM PELVIS SELECTIVE OR SUPRASELECTIVE  11/26/2022   IR RADIOLOGIST EVAL & MGMT  10/31/2022   IR RADIOLOGIST EVAL & MGMT  11/13/2022   IR RADIOLOGIST EVAL & MGMT  01/07/2023   IR TRANSCATH PLC STENT 1ST ART NOT LE CV CAR VERT CAR  11/26/2022   IR US GUIDE VASC ACCESS LEFT  11/26/2022   IR US GUIDE VASC ACCESS RIGHT  11/26/2022    Allergies: Aspirin, Atorvastatin, Doxycycline, Erythromycin, Macrolides and ketolides, Penicillins, and Sulfa antibiotics  Medications: Prior to Admission medications   Medication Sig Start Date End Date Taking? Authorizing Provider  acetaminophen (TYLENOL) 325 MG tablet Take 650 mg by mouth every 6 (six) hours as needed for  moderate pain.    [provider]  apixaban (ELIQUIS) 5 MG TABS tablet Take 5 mg by mouth 1 day or 1 dose.    [provider]  aspirin EC 81 MG tablet Take 1 tablet (81 mg total) by mouth daily. Swallow whole. 11/26/22   Hoyt Koch, PA  clopidogrel (PLAVIX) 75 MG tablet  03/13/23   [provider]  HYDROcodone-acetaminophen (NORCO/VICODIN) 5-325 MG per tablet Take 2 tablets by mouth every 4 (four) hours as needed for pain. 04/25/13   Rancour, Jeannett Senior, MD  ibuprofen (ADVIL) 800 MG tablet Take 1 tablet (800 mg total) by mouth every 6 (six) hours as needed. 03/23/23   Candelaria Stagers, DPM  ibuprofen (ADVIL,MOTRIN) 200 MG tablet Take 400 mg by mouth every 6 (six) hours as needed for pain.     [provider]  levothyroxine (SYNTHROID, LEVOTHROID) 88 MCG tablet Take 88 mcg by mouth daily.    [provider]  meclizine (ANTIVERT) 25 MG tablet TAKE 1 TABLET BY MOUTH EVERY 6 HOURS AS NEEDED FOR DIZZINESS    [provider]  olmesartan-hydrochlorothiazide (BENICAR HCT) 40-12.5 MG tablet Take 1 tablet by mouth daily.    [provider]  oxyCODONE-acetaminophen (PERCOCET) 5-325 MG tablet Take 1 tablet by mouth every 4 (four) hours as needed for severe pain. 03/23/23   Candelaria Stagers, DPM  pantoprazole (PROTONIX) 40 MG tablet Take 40 mg by mouth daily.    [provider]  rosuvastatin (CRESTOR) 5 MG tablet Take 5 mg by mouth daily.    [provider]  Vitamin D, Ergocalciferol, (DRISDOL) 50000 UNITS CAPS Take 50,000 Units by mouth.    [provider]     Family History  Problem Relation Age of Onset   Diabetes Mother    Alzheimer's disease Father     Social History   Socioeconomic History   Marital status: Married    Spouse name: Not on file   Number of children: Not on file   Years of education: Not on file   Highest education level: Not on file  Occupational History   Not on file  Tobacco Use   Smoking status: Every Day   Smokeless tobacco: Not on file  Substance and Sexual Activity   Alcohol use: No   Drug use: No   Sexual activity: Not on file  Other Topics Concern   Not on file  Social History Narrative   Not on file   Social Determinants of Health   Financial Resource Strain: Not on file  Food Insecurity: Not on file  Transportation Needs: Not on file  Physical Activity: Not on file  Stress: Not on file  Social Connections: Not on file       Review of Systems: A 12 point ROS discussed and pertinent positives are indicated in the HPI above.  All other systems are negative.  Review of Systems  Vital Signs: BP (!) 150/70   Pulse 62   Temp 98.5 F (36.9 C)   Resp 20   SpO2 96%        Physical Exam General: 82 yo female appearing stated age.  Well-developed, well-nourished.  No distress. HEENT: Atraumatic, normocephalic.  Conjugate gaze, extra-ocular motor intact. No scleral icterus or scleral injection.   Neck: Symmetric with no goiter enlargement.  Chest/Lungs:  Symmetric chest with inspiration/expiration.  No labored breathing.    Heart:  No JVD appreciated.  Abdomen:  Obese Genito-urinary: Deferred Neurologic: Alert & Oriented to  person, place, and time.   Normal affect and insight.  Appropriate questions.  Moving all 4 extremities with gross sensory intact.  Pulse Exam:   Doppler positive right AT & PT.  Doppler positive left AT & PT.  Extremities: Healed surgical site on the left great toe.  No intertriginous wounds.  Trophic changes of the bilateral feet, worst on the right, with dry flaking skin, hairless.    Mallampati Score:     Imaging: No results found.  Labs:  CBC: Recent Labs    11/26/22 0754  WBC 7.6  HGB 11.9*  HCT 36.3  PLT 157    COAGS: Recent Labs    11/26/22 0754  INR 1.0    BMP: Recent Labs    11/26/22 0754  NA 137  K 3.9  CL 103  CO2 26  GLUCOSE 100*  BUN 25*  CALCIUM 9.2  CREATININE 1.57*  GFRNONAA 32*    LIVER FUNCTION TESTS: No results for input(s): "BILITOT", "AST", "ALT", "ALKPHOS", "PROT", "ALBUMIN" in the last 8760 hours.  TUMOR MARKERS: No results for input(s): "AFPTM", "CEA", "CA199", "CHROMGRNA" in the last 8760 hours.      Assessment:  Jenna Hanaway is SP treatment of inflow disease, with kissing stents, for her Rutherford 4 class symptoms previously, now resolved.  (Stable chronic [id number])    She has healed her left foot surgical site.   We discussed ongoing foot care and max medical therapy.   Regarding medical management, maximal medical therapy for reduction of risk factors is indicated as recommended by updated AHA guidelines1.  This includes anti-platelet medication, tight blood  glucose control to a HbA1c < 7, tight blood pressure control, maximum-dose HMG-CoA reductase inhibitor, and smoking cessation.  Smoking cessation was addressed, with strategies including counseling and nicotine replacement.  She is not interested right now to quit, she tells me.   She also tells me that she cannot tolerate cholesterol medications, and she take "red yeast rice" for the effect.  I emphasized that these HMG-CoA reductase inhibitors have pleiotrophic effects that are beneficial.    Plan: - At this point she can contact us on as needed basis, with no scheduled follow up at ;this time.  - I recommend that she continue with routine foot/podiatric care with her existing team, now that she has been discharged from wound care -Recommend maximal medical therapy for cardiovascular risk reduction, including single anti-platelet therapy in addition to her BID eliquis ([facility] PAD, Compass support). -Recommend again initiating smoking cessation measures.  She does not seem interested at this time.    ___________________________________________________________________   1Monte Fantasia MD, et al. 2016 AHA/ACC Guideline on the Management of Patients With Lower Extremity Peripheral Artery Disease: Executive Summary: A Report of the American College of Cardiology/American Heart Association Task Force on Clinical Practice Guidelines. J Am Coll Cardiol. 2017 Mar 21;69(11):1465-1508. doi: 10.1016/j.jacc.2016.11.008.   2 - Norgren L, et al. TASC II Working Group. Inter-society consensus for the management of peripheral arterial disease. Int Nunzio Cobbs. 2007 Jun;26(2):81-157. Review. PubMed PMID: 97673419  3 - Hingorani A, et al. The management of diabetic foot: A clinical practice guideline by the Society for Vascular Surgery in collaboration with the American Podiatric Medical Association and the Society  for Vascular Medicine. J Vasc Surg. 2016 Feb;63(2 Suppl):3S-21S. doi: 10.1016/j.jvs.2015.10.003.  PubMed PMID: 37902409.  4 - Luther Hearing, Saab FA, Elyse Jarvis, Danae Orleans, Deeann Cree, Driver VR, [place], Lookstein R, van den Tilman Neat, Jaff MR, Reinaldo Raddle, Alric Seton, AlMahameed A,  Katzen B. Digital Subtraction Angiography Prior to an Amputation for Critical Limb Ischemia (CLI): An Expert Recommendation Statement From the CLI Global Society to Optimize Limb Salvage. J Endovasc Ther. 2020 Aug;27(4):540-546. doi: 10.1177/1526602820928590. Epub 2020 May 29. PMID: 86578469.    Electronically Signed: Gilmer Mor 05/29/2023, 1:38 PM   I spent a total of    25 Minutes in face to face in clinical consultation, review of labs, previous notes/history, gaining additional history from the patient, counsel, review or prior op reports/angiograms, exam, and documentation, with greater than 50% of which was counseling/coordinating care for PAD, SP kissing stent treatment of iliac disease.

## 2023-07-06 DIAGNOSIS — H353131 Nonexudative age-related macular degeneration, bilateral, early dry stage: Secondary | ICD-10-CM | POA: Diagnosis not present

## 2023-07-20 DIAGNOSIS — Z85828 Personal history of other malignant neoplasm of skin: Secondary | ICD-10-CM | POA: Diagnosis not present

## 2023-07-20 DIAGNOSIS — L720 Epidermal cyst: Secondary | ICD-10-CM | POA: Diagnosis not present

## 2023-07-20 DIAGNOSIS — L821 Other seborrheic keratosis: Secondary | ICD-10-CM | POA: Diagnosis not present

## 2023-08-04 DIAGNOSIS — J209 Acute bronchitis, unspecified: Secondary | ICD-10-CM | POA: Diagnosis not present

## 2023-08-04 DIAGNOSIS — F17200 Nicotine dependence, unspecified, uncomplicated: Secondary | ICD-10-CM | POA: Diagnosis not present

## 2023-08-04 DIAGNOSIS — J449 Chronic obstructive pulmonary disease, unspecified: Secondary | ICD-10-CM | POA: Diagnosis not present

## 2023-08-04 DIAGNOSIS — R5383 Other fatigue: Secondary | ICD-10-CM | POA: Diagnosis not present

## 2023-08-04 DIAGNOSIS — Z1152 Encounter for screening for COVID-19: Secondary | ICD-10-CM | POA: Diagnosis not present

## 2023-08-04 DIAGNOSIS — J189 Pneumonia, unspecified organism: Secondary | ICD-10-CM | POA: Diagnosis not present

## 2023-08-04 DIAGNOSIS — R0981 Nasal congestion: Secondary | ICD-10-CM | POA: Diagnosis not present

## 2023-08-04 DIAGNOSIS — J029 Acute pharyngitis, unspecified: Secondary | ICD-10-CM | POA: Diagnosis not present

## 2023-08-04 DIAGNOSIS — R058 Other specified cough: Secondary | ICD-10-CM | POA: Diagnosis not present

## 2023-08-11 DIAGNOSIS — D649 Anemia, unspecified: Secondary | ICD-10-CM | POA: Diagnosis not present

## 2023-08-27 ENCOUNTER — Other Ambulatory Visit: Payer: Self-pay | Admitting: Interventional Radiology

## 2023-08-27 DIAGNOSIS — I739 Peripheral vascular disease, unspecified: Secondary | ICD-10-CM

## 2023-09-05 DIAGNOSIS — R42 Dizziness and giddiness: Secondary | ICD-10-CM | POA: Diagnosis not present

## 2023-09-05 DIAGNOSIS — H6591 Unspecified nonsuppurative otitis media, right ear: Secondary | ICD-10-CM | POA: Diagnosis not present

## 2023-09-05 DIAGNOSIS — H6992 Unspecified Eustachian tube disorder, left ear: Secondary | ICD-10-CM | POA: Diagnosis not present

## 2023-09-09 ENCOUNTER — Other Ambulatory Visit: Payer: Medicare HMO

## 2023-09-18 DIAGNOSIS — J449 Chronic obstructive pulmonary disease, unspecified: Secondary | ICD-10-CM | POA: Diagnosis not present

## 2023-09-18 DIAGNOSIS — I129 Hypertensive chronic kidney disease with stage 1 through stage 4 chronic kidney disease, or unspecified chronic kidney disease: Secondary | ICD-10-CM | POA: Diagnosis not present

## 2023-09-18 DIAGNOSIS — I739 Peripheral vascular disease, unspecified: Secondary | ICD-10-CM | POA: Diagnosis not present

## 2023-09-18 DIAGNOSIS — K219 Gastro-esophageal reflux disease without esophagitis: Secondary | ICD-10-CM | POA: Diagnosis not present

## 2023-09-18 DIAGNOSIS — I82432 Acute embolism and thrombosis of left popliteal vein: Secondary | ICD-10-CM | POA: Diagnosis not present

## 2023-09-18 DIAGNOSIS — R195 Other fecal abnormalities: Secondary | ICD-10-CM | POA: Diagnosis not present

## 2023-09-18 DIAGNOSIS — I872 Venous insufficiency (chronic) (peripheral): Secondary | ICD-10-CM | POA: Diagnosis not present

## 2023-09-18 DIAGNOSIS — N1832 Chronic kidney disease, stage 3b: Secondary | ICD-10-CM | POA: Diagnosis not present

## 2023-09-18 DIAGNOSIS — M810 Age-related osteoporosis without current pathological fracture: Secondary | ICD-10-CM | POA: Diagnosis not present

## 2023-09-18 DIAGNOSIS — D649 Anemia, unspecified: Secondary | ICD-10-CM | POA: Diagnosis not present

## 2023-09-18 DIAGNOSIS — J309 Allergic rhinitis, unspecified: Secondary | ICD-10-CM | POA: Diagnosis not present

## 2023-09-18 DIAGNOSIS — R7302 Impaired glucose tolerance (oral): Secondary | ICD-10-CM | POA: Diagnosis not present

## 2023-09-21 DIAGNOSIS — Z008 Encounter for other general examination: Secondary | ICD-10-CM | POA: Diagnosis not present

## 2023-09-28 ENCOUNTER — Telehealth: Payer: Self-pay | Admitting: Student

## 2023-09-28 NOTE — Telephone Encounter (Signed)
Interventional Radiology Brief Note:  Received automated refill request for Plavix for Ms. Jenna Ryan.  Per medication history patient is on Eliquis and aspirin as well.  Per most recent follow-up note, patient is following with IR as needed and single anti-platelet therapy was recommended by Dr. Loreta Ave.  No refill of Plavix at this time.   Loyce Dys, MS RD PA-C

## 2023-11-04 ENCOUNTER — Ambulatory Visit
Admission: RE | Admit: 2023-11-04 | Discharge: 2023-11-04 | Disposition: A | Discharge status: Home / Self Care | Payer: Medicare HMO | Source: Ambulatory Visit | Attending: Interventional Radiology | Admitting: Interventional Radiology

## 2023-11-04 DIAGNOSIS — I739 Peripheral vascular disease, unspecified: Secondary | ICD-10-CM

## 2023-11-04 HISTORY — PX: IR RADIOLOGIST EVAL & MGMT: IMG5224

## 2023-11-04 NOTE — Progress Notes (Signed)
 Chief Complaint: PAD, treated for inflow disease with kissing stents   Referring Physician(s): Patel,Kevin P   PCP: Dr. Felipa Eth, [facility]   History of Present Illness: Jenna Ryan is a 83 y.o. female presenting today as self referral for evaluation of lower extremity bruising.    Jenna Ryan joins [place] today in the clinic with her sister   History:  We met Jenna Ryan 10/31/22. At the time she was describing increasing pain in her left toe and forefoot, with associated pain that extends along the plantar aspect of the left foot towards the heel.  Also describes some "numbness" of the forefoot involving the first through fourth toes.       She was  ambulating far enough for claudication.     She denies history of MI or stroke.  She does not have a history of CHF.  She was treated more than 10 years ago for a superficial skin cancer of the left lower extremity.     She is a widow, as her husband passed about 3 years ago.  She had 1 son who passed a few years ago at the age of 64 (she states adopted).  She lives by herself.  She has 1 brother and 1 sister.  Her sister, [name], is her POA.  She also has a niece from her husband's family who is a Engineer, civil (consulting) and is involved with her health care decision making.    Cardiovascular risk factors include: Smoking (1ppd since 83yo), HTN,    History of DVT September 2023.   eliquis now.    Non-invasive exam:  Right ABI: 0.51 Right TBI:0.22 Left ABI: 0.31 Left TBI: unable to insonate    Non-invasive exam 01/07/23 Right ABI: 0.72 Right TBI: 0.39, SBP of 54 Left ABI: 0.55 Left TBI: 0.38, SBP of 52   Interval History Today she tells me that she set up the appointment because of "the way my foot looks."    She is concerned with some bruising that she has on the medial hindfoot of the left foot.  She says this has been there for over a week, but not sure exactly when it started.    She denies known trauma, any event where  she had pain, denies inversion/eversion ankle injury.    She says her feet are "cold all the time."  This is not new and has been ongoing complaint.  She is able to move her feet without pain at this time.  No pain at night that wakes her. She is not ambulating far but does it comfortably at this time.   She also reports that she has a bruise on the anterior right shin that happened yesterday when she was working around her house. Denies any significant pain at that site .    I reviewed previous operative notes, imaging, angiograms, non-invasive exams, progress notes, and lab tests.      She continues to smoke.  She is taking BID  eliquis.  She has stopped taking ASA daily    Past Medical History:  Diagnosis Date   Cancer [facility])    skin    Past Surgical History:  Procedure Laterality Date   CHOLECYSTECTOMY     IR ANGIOGRAM EXTREMITY BILATERAL  11/26/2022   IR ANGIOGRAM PELVIS SELECTIVE OR SUPRASELECTIVE  11/26/2022   IR RADIOLOGIST EVAL & MGMT  10/31/2022   IR RADIOLOGIST EVAL & MGMT  11/13/2022   IR RADIOLOGIST EVAL & MGMT  01/07/2023  IR RADIOLOGIST EVAL & MGMT  05/29/2023   IR TRANSCATH PLC STENT 1ST ART NOT LE CV CAR VERT CAR  11/26/2022   IR US GUIDE VASC ACCESS LEFT  11/26/2022   IR US GUIDE VASC ACCESS RIGHT  11/26/2022    Allergies: Aspirin, Atorvastatin, Doxycycline, Erythromycin, Macrolides and ketolides, Penicillins, and Sulfa antibiotics  Medications: Prior to Admission medications   Medication Sig Start Date End Date Taking? Authorizing Provider  acetaminophen (TYLENOL) 325 MG tablet Take 650 mg by mouth every 6 (six) hours as needed for moderate pain.    [provider]  apixaban (ELIQUIS) 5 MG TABS tablet Take 5 mg by mouth 1 day or 1 dose.    [provider]  aspirin EC 81 MG tablet Take 1 tablet (81 mg total) by mouth daily. Swallow whole. 11/26/22   Hoyt Koch, PA  clopidogrel (PLAVIX) 75 MG tablet  03/13/23   [provider]   HYDROcodone-acetaminophen (NORCO/VICODIN) 5-325 MG per tablet Take 2 tablets by mouth every 4 (four) hours as needed for pain. 04/25/13   Rancour, Jeannett Senior, MD  ibuprofen (ADVIL) 800 MG tablet Take 1 tablet (800 mg total) by mouth every 6 (six) hours as needed. 03/23/23   Candelaria Stagers, DPM  ibuprofen (ADVIL,MOTRIN) 200 MG tablet Take 400 mg by mouth every 6 (six) hours as needed for pain.    [provider]  levothyroxine (SYNTHROID, LEVOTHROID) 88 MCG tablet Take 88 mcg by mouth daily.    [provider]  meclizine (ANTIVERT) 25 MG tablet TAKE 1 TABLET BY MOUTH EVERY 6 HOURS AS NEEDED FOR DIZZINESS    [provider]  olmesartan-hydrochlorothiazide (BENICAR HCT) 40-12.5 MG tablet Take 1 tablet by mouth daily.    [provider]  oxyCODONE-acetaminophen (PERCOCET) 5-325 MG tablet Take 1 tablet by mouth every 4 (four) hours as needed for severe pain. 03/23/23   Candelaria Stagers, DPM  pantoprazole (PROTONIX) 40 MG tablet Take 40 mg by mouth daily.    [provider]  rosuvastatin (CRESTOR) 5 MG tablet Take 5 mg by mouth daily.    [provider]  Vitamin D, Ergocalciferol, (DRISDOL) 50000 UNITS CAPS Take 50,000 Units by mouth.    [provider]     Family History  Problem Relation Age of Onset   Diabetes Mother    Alzheimer's disease Father     Social History   Socioeconomic History   Marital status: Married    Spouse name: Not on file   Number of children: Not on file   Years of education: Not on file   Highest education level: Not on file  Occupational History   Not on file  Tobacco Use   Smoking status: Every Day   Smokeless tobacco: Not on file  Substance and Sexual Activity   Alcohol use: No   Drug use: No   Sexual activity: Not on file  Other Topics Concern   Not on file  Social History Narrative   Not on file   Social Drivers of Health   Financial Resource Strain: Not on file  Food Insecurity: Not on file   Transportation Needs: Not on file  Physical Activity: Not on file  Stress: Not on file  Social Connections: Not on file       Review of Systems: A 12 point ROS discussed and pertinent positives are indicated in the HPI above.  All other systems are negative.  Review of Systems  Vital Signs: BP (!) 159/71  Pulse 71   Temp 97.6 F (36.4 C) (Oral)   Resp 19   SpO2 97%       Physical Exam General: 83 yo female appearing stated age.  Well-developed, well-nourished.  No distress. HEENT: Atraumatic, normocephalic.  Conjugate gaze, extra-ocular motor intact. No scleral icterus or scleral injection. No lesions on external ears, nose, lips, or gums.  Oral mucosa moist, pink.  Neck: Symmetric with no goiter enlargement.  Chest/Lungs:  Symmetric chest with inspiration/expiration.  No labored breathing.    Heart:   No JVD appreciated.  Genito-urinary: Deferred Neurologic: Alert & Oriented to person, place, and time.   Normal affect and insight.  Appropriate questions.  Moving all 4 extremities with gross sensory intact.  Pulse Exam:   Doppler positive right AT & PT.  Doppler positive left AT & PT.  Skin: dry, flaking, clear trophic changes of bilateral lower extremity.   Extremities: Hematoma on the anterior right mid calf.  Slight TTP.   Motor and sensory intact bilaterally.  Ecchymosis of the medial left hindfoot under the malleolus.  She denies any TTP of the achilles and has full ROM of the ankle comfortably.  Denies any TTP of the left forefoot.  Denies any TTP of the calf muscle. .     Mallampati Score:     Imaging: No results found.  Labs:  CBC: Recent Labs    11/26/22 0754  WBC 7.6  HGB 11.9*  HCT 36.3  PLT 157    COAGS: Recent Labs    11/26/22 0754  INR 1.0    BMP: Recent Labs    11/26/22 0754  NA 137  K 3.9  CL 103  CO2 26  GLUCOSE 100*  BUN 25*  CALCIUM 9.2  CREATININE 1.57*  GFRNONAA 32*    LIVER FUNCTION TESTS: No results for input(s):  "BILITOT", "AST", "ALT", "ALKPHOS", "PROT", "ALBUMIN" in the last 8760 hours.  TUMOR MARKERS: No results for input(s): "AFPTM", "CEA", "CA199", "CHROMGRNA" in the last 8760 hours.  Assessment and Plan:  Jenna Ryan is an 83 yo female with known PAD and new complaint of bruising of the left foot. She is concerned that this is an acute artery problem.    She certainly has some ecchymosis of the left medial hindfoot.  Her pulses are intact, foot warm, well perfused, motor sensory intact.    I assured her this is not an acute artery problem, or any vascular problem.   I do think that she has had some minor trauma that is causing the ecchymosis, as she has a small hematoma on the anterior right shin that she sustained yesterday from a bump in her yard.  She is on eliquis for Afib. While the left foot might be from minor injury, possibly baker's cyst rupture, plantaris tendon rupture, or other such inconsequential injury, I am doubtful of achilles injury as she has no pain, no TTP, and function is intact.   I did suggest conservative measures for the bruising, such as rest, ice, compression, elevation, and that if needed for further evaluation she can contact her PCP.  She understands and grateful.   I also reminded her that she needs to stop smoking. She understands, but I do not think she is motivated at this time.   We are happy to see back on as needed basis.   Electronically Signed: Gilmer Mor 11/04/2023, 12:16 PM   I spent a total of    25 Minutes in face to face in clinical  consultation, greater than 50% of which was counseling/coordinating care for bruising, known PAD

## 2023-12-14 DIAGNOSIS — R7302 Impaired glucose tolerance (oral): Secondary | ICD-10-CM | POA: Diagnosis not present

## 2023-12-14 DIAGNOSIS — M810 Age-related osteoporosis without current pathological fracture: Secondary | ICD-10-CM | POA: Diagnosis not present

## 2023-12-14 DIAGNOSIS — E785 Hyperlipidemia, unspecified: Secondary | ICD-10-CM | POA: Diagnosis not present

## 2023-12-14 DIAGNOSIS — D649 Anemia, unspecified: Secondary | ICD-10-CM | POA: Diagnosis not present

## 2023-12-14 DIAGNOSIS — E039 Hypothyroidism, unspecified: Secondary | ICD-10-CM | POA: Diagnosis not present

## 2023-12-14 DIAGNOSIS — I129 Hypertensive chronic kidney disease with stage 1 through stage 4 chronic kidney disease, or unspecified chronic kidney disease: Secondary | ICD-10-CM | POA: Diagnosis not present

## 2023-12-14 DIAGNOSIS — N1832 Chronic kidney disease, stage 3b: Secondary | ICD-10-CM | POA: Diagnosis not present

## 2023-12-17 DIAGNOSIS — I872 Venous insufficiency (chronic) (peripheral): Secondary | ICD-10-CM | POA: Diagnosis not present

## 2023-12-17 DIAGNOSIS — Z1331 Encounter for screening for depression: Secondary | ICD-10-CM | POA: Diagnosis not present

## 2023-12-17 DIAGNOSIS — I82432 Acute embolism and thrombosis of left popliteal vein: Secondary | ICD-10-CM | POA: Diagnosis not present

## 2023-12-17 DIAGNOSIS — F17200 Nicotine dependence, unspecified, uncomplicated: Secondary | ICD-10-CM | POA: Diagnosis not present

## 2023-12-17 DIAGNOSIS — Z0000 Encounter for general adult medical examination without abnormal findings: Secondary | ICD-10-CM | POA: Diagnosis not present

## 2023-12-17 DIAGNOSIS — R82998 Other abnormal findings in urine: Secondary | ICD-10-CM | POA: Diagnosis not present

## 2023-12-17 DIAGNOSIS — J309 Allergic rhinitis, unspecified: Secondary | ICD-10-CM | POA: Diagnosis not present

## 2023-12-17 DIAGNOSIS — M810 Age-related osteoporosis without current pathological fracture: Secondary | ICD-10-CM | POA: Diagnosis not present

## 2023-12-17 DIAGNOSIS — J449 Chronic obstructive pulmonary disease, unspecified: Secondary | ICD-10-CM | POA: Diagnosis not present

## 2023-12-17 DIAGNOSIS — M79672 Pain in left foot: Secondary | ICD-10-CM | POA: Diagnosis not present

## 2023-12-17 DIAGNOSIS — N1832 Chronic kidney disease, stage 3b: Secondary | ICD-10-CM | POA: Diagnosis not present

## 2023-12-17 DIAGNOSIS — E785 Hyperlipidemia, unspecified: Secondary | ICD-10-CM | POA: Diagnosis not present

## 2023-12-17 DIAGNOSIS — E039 Hypothyroidism, unspecified: Secondary | ICD-10-CM | POA: Diagnosis not present

## 2023-12-17 DIAGNOSIS — Z1339 Encounter for screening examination for other mental health and behavioral disorders: Secondary | ICD-10-CM | POA: Diagnosis not present

## 2023-12-17 DIAGNOSIS — I739 Peripheral vascular disease, unspecified: Secondary | ICD-10-CM | POA: Diagnosis not present

## 2023-12-17 DIAGNOSIS — I129 Hypertensive chronic kidney disease with stage 1 through stage 4 chronic kidney disease, or unspecified chronic kidney disease: Secondary | ICD-10-CM | POA: Diagnosis not present

## 2024-02-01 DIAGNOSIS — M25551 Pain in right hip: Secondary | ICD-10-CM | POA: Diagnosis not present

## 2024-02-01 DIAGNOSIS — I872 Venous insufficiency (chronic) (peripheral): Secondary | ICD-10-CM | POA: Diagnosis not present

## 2024-02-01 DIAGNOSIS — W19XXXA Unspecified fall, initial encounter: Secondary | ICD-10-CM | POA: Diagnosis not present

## 2024-02-01 DIAGNOSIS — R1031 Right lower quadrant pain: Secondary | ICD-10-CM | POA: Diagnosis not present

## 2024-07-06 ENCOUNTER — Other Ambulatory Visit: Payer: Self-pay | Admitting: Adult Health Nurse Practitioner

## 2024-07-06 DIAGNOSIS — Z86718 Personal history of other venous thrombosis and embolism: Secondary | ICD-10-CM

## 2024-07-13 ENCOUNTER — Ambulatory Visit
Admission: RE | Admit: 2024-07-13 | Discharge: 2024-07-13 | Disposition: A | Source: Ambulatory Visit | Attending: Adult Health Nurse Practitioner | Admitting: Adult Health Nurse Practitioner

## 2024-07-13 ENCOUNTER — Ambulatory Visit

## 2024-07-13 DIAGNOSIS — Z86718 Personal history of other venous thrombosis and embolism: Secondary | ICD-10-CM
